# Patient Record
Sex: Female | Born: 1985 | Race: Black or African American | Hispanic: No | Smoking: Never smoker
Health system: Southern US, Community
[De-identification: ages and names within clinical notes are randomized; demographics above are authoritative.]

## PROBLEM LIST (undated history)

## (undated) DIAGNOSIS — E349 Endocrine disorder, unspecified: Secondary | ICD-10-CM

## (undated) HISTORY — DX: Endocrine disorder, unspecified: E34.9

## (undated) HISTORY — PX: CYST REMOVAL HAND: SHX6279

---

## 2016-12-19 HISTORY — PX: BREAST SURGERY: SHX581

## 2016-12-19 HISTORY — PX: OTHER SURGICAL HISTORY: SHX169

## 2017-07-31 ENCOUNTER — Ambulatory Visit: Payer: Self-pay | Admitting: Obstetrics and Gynecology

## 2017-08-01 ENCOUNTER — Ambulatory Visit (INDEPENDENT_AMBULATORY_CARE_PROVIDER_SITE_OTHER): Payer: Managed Care, Other (non HMO) | Admitting: Obstetrics and Gynecology

## 2017-08-01 ENCOUNTER — Encounter: Payer: Self-pay | Admitting: Obstetrics and Gynecology

## 2017-08-01 VITALS — BP 118/82 | HR 77 | Ht 66.0 in | Wt 177.0 lb

## 2017-08-01 DIAGNOSIS — Z124 Encounter for screening for malignant neoplasm of cervix: Secondary | ICD-10-CM

## 2017-08-01 DIAGNOSIS — Z0000 Encounter for general adult medical examination without abnormal findings: Secondary | ICD-10-CM

## 2017-08-01 DIAGNOSIS — Z01419 Encounter for gynecological examination (general) (routine) without abnormal findings: Secondary | ICD-10-CM | POA: Diagnosis not present

## 2017-08-01 DIAGNOSIS — N979 Female infertility, unspecified: Secondary | ICD-10-CM | POA: Diagnosis not present

## 2017-08-02 ENCOUNTER — Encounter: Payer: Self-pay | Admitting: Obstetrics and Gynecology

## 2017-08-02 NOTE — Progress Notes (Signed)
Gynecology Annual Exam  PCP: Patient, No Pcp Per  Chief Complaint:  Chief Complaint  Patient presents with  . Gynecologic Exam    vaginal lump on outside  . general labs    History of Present Illness: Patient is a 32 y.o. [id number] presents for annual exam. The patient recently moved here from [place]. She reports that in [place] she was being evaluated for infertility. She says that she began having hot flashes a few years ago. She has a FSH and AMH drawn which showed that she was menopausal. She says that she changed her diet and started exercising more. She reports that her hotflashes resolved and now she has been having more regular periods. She reports that she was seeing a REI, having regular ultrasounds to access if she was ovulating.  I discussed with the patient that given her history of an elevated FSH it would be best to refer her to an REI specialist to continue treatment. The patient reports that her benefits for REI referral have ended and she declined a referral.  LMP: Patient's last menstrual period was 07/14/2017. Average Interval: regular, 30 + days days Duration of flow: 5 days Heavy Menses: no Clots: no Intermenstrual Bleeding: no Postcoital Bleeding: no Dysmenorrhea: no   The patient is sexually active. She currently uses none for contraception. She denies dyspareunia.  The patient does not perform self breast exams.  There is no notable family history of breast or ovarian cancer in her family.  The patient wears seatbelts: yes.   The patient has regular exercise: yes.    The patient denies current symptoms of depression.    Review of Systems: Review of Systems  Constitutional: Negative for chills, fever, malaise/fatigue and weight loss.  HENT: Negative for congestion, hearing loss and sinus pain.   Eyes: Negative for blurred vision and double vision.  Respiratory: Negative for cough, sputum production, shortness of breath and wheezing.   Cardiovascular:  Negative for chest pain, palpitations, orthopnea and leg swelling.  Gastrointestinal: Negative for abdominal pain, constipation, diarrhea, nausea and vomiting.  Genitourinary: Negative for dysuria, flank pain, frequency, hematuria and urgency.  Musculoskeletal: Negative for back pain, falls and joint pain.  Skin: Negative for itching and rash.  Neurological: Negative for dizziness and headaches.  Psychiatric/Behavioral: Negative for depression, substance abuse and suicidal ideas. The patient is not nervous/anxious.     Past Medical History:  History reviewed. No pertinent past medical history.  Past Surgical History:  Past Surgical History:  Procedure Laterality Date  . BREAST SURGERY  12/19/2016   Reduction  . CESAREAN SECTION  05/03/2010  . tummy tuck  12/19/2016    Gynecologic History:  Patient's last menstrual period was 07/14/2017. Contraception: none Last Pap: Results were: unknown  Obstetric History: G2P1012 Hx of twin gestation   Family History:  Family History  Problem Relation Age of Onset  . Breast cancer Other 63    Social History:  Social History   Socioeconomic History  . Marital status: Married    Spouse name: Not on file  . Number of children: Not on file  . Years of education: Not on file  . Highest education level: Not on file  Social Needs  . Financial resource strain: Not on file  . Food insecurity - worry: Not on file  . Food insecurity - inability: Not on file  . Transportation needs - medical: Not on file  . Transportation needs - non-medical: Not on file  Occupational History  .  Not on file  Tobacco Use  . Smoking status: Never Smoker  . Smokeless tobacco: Never Used  Substance and Sexual Activity  . Alcohol use: No    Frequency: Never  . Drug use: No  . Sexual activity: Yes    Partners: Male    Birth control/protection: None  Other Topics Concern  . Not on file  Social History Narrative  . Not on file    Allergies:    Allergies  Allergen Reactions  . Iodine Anaphylaxis  . Latex Hives  . Penicillins     Medications: Prior to Admission medications   Not on File    Physical Exam Vitals: Blood pressure 118/82, pulse 77, height 5\' 6"  (1.676 m), weight 177 lb (80.3 kg), last menstrual period 07/14/2017.  Physical Exam  Constitutional: She is oriented to person, place, and time. She appears well-developed.  Genitourinary: Vagina normal and uterus normal. There is no lesion on the right labia. There is no lesion on the left labia. Vagina exhibits no lesion. Right adnexum does not display mass. Left adnexum does not display mass. Cervix does not exhibit motion tenderness.  HENT:  Head: Normocephalic and atraumatic.  Eyes: EOM are normal.  Neck: Neck supple. No thyromegaly present.  Cardiovascular: Normal rate, regular rhythm and normal heart sounds.  Pulmonary/Chest: Effort normal and breath sounds normal. Right breast exhibits no inverted nipple, no mass, no nipple discharge and no skin change. Left breast exhibits no inverted nipple, no mass, no nipple discharge and no skin change.  Abdominal: Soft. Bowel sounds are normal. She exhibits no distension and no mass.  Neurological: She is alert and oriented to person, place, and time.  Skin: Skin is warm and dry.  Psychiatric: She has a normal mood and affect. Her behavior is normal. Judgment and thought content normal.  Vitals reviewed.    Female chaperone present for pelvic and breast  portions of the physical exam  Assessment: 32 y.o. [id number] routine annual exam  Plan: Problem List Items Addressed This Visit    None    Visit Diagnoses    Screening for cervical cancer    -  Primary   Relevant Orders   PapIG, HPV, rfx 16/18   Health care maintenance       Relevant Orders   CBC w/Diff   Comprehensive metabolic panel   Lipid panel   TSH   Infertility, female       Relevant Orders   Anti mullerian hormone   FSH/LH     1) STI screening  was offered and declined  2) ASCCP guidelines and rational discussed.  Patient opts for pap smear performed today.   3) Routine healthcare maintenance including cholesterol, diabetes screening discussed To return fasting at a later date  4) Hx of infertility, patient declines referral. Desires to have FSH and AMH repeated since menses have resumed. Will have her return to discuss infertility management plan.  Adelene Idlerhristanna Konstantin Lehnen MD 08/02/17 10:23 PM

## 2017-08-05 LAB — PAPIG, HPV, RFX 16/18
HPV, HIGH-RISK: NEGATIVE
PAP SMEAR COMMENT: 0

## 2017-08-05 NOTE — Progress Notes (Signed)
Called patient, will need to repeat pap smear. Patient aware.

## 2017-08-07 ENCOUNTER — Other Ambulatory Visit: Payer: Managed Care, Other (non HMO)

## 2017-08-08 ENCOUNTER — Other Ambulatory Visit: Payer: Managed Care, Other (non HMO)

## 2017-08-08 ENCOUNTER — Other Ambulatory Visit: Payer: Self-pay | Admitting: Obstetrics and Gynecology

## 2017-08-15 LAB — CBC WITH DIFFERENTIAL/PLATELET
BASOS ABS: 0 10*3/uL (ref 0.0–0.2)
Basos: 0 %
EOS (ABSOLUTE): 0 10*3/uL (ref 0.0–0.4)
Eos: 1 %
Hematocrit: 33.7 % — ABNORMAL LOW (ref 34.0–46.6)
Hemoglobin: 11.2 g/dL (ref 11.1–15.9)
Immature Grans (Abs): 0 10*3/uL (ref 0.0–0.1)
Immature Granulocytes: 0 %
LYMPHS ABS: 1.6 10*3/uL (ref 0.7–3.1)
Lymphs: 39 %
MCH: 29 pg (ref 26.6–33.0)
MCHC: 33.2 g/dL (ref 31.5–35.7)
MCV: 87 fL (ref 79–97)
MONOS ABS: 0.2 10*3/uL (ref 0.1–0.9)
Monocytes: 5 %
Neutrophils Absolute: 2.3 10*3/uL (ref 1.4–7.0)
Neutrophils: 55 %
Platelets: 241 10*3/uL (ref 150–379)
RBC: 3.86 x10E6/uL (ref 3.77–5.28)
RDW: 13.1 % (ref 12.3–15.4)
WBC: 4.1 10*3/uL (ref 3.4–10.8)

## 2017-08-15 LAB — COMPREHENSIVE METABOLIC PANEL
ALK PHOS: 49 IU/L (ref 39–117)
ALT: 9 IU/L (ref 0–32)
AST: 12 IU/L (ref 0–40)
Albumin/Globulin Ratio: 1.5 (ref 1.2–2.2)
Albumin: 4.3 g/dL (ref 3.5–5.5)
BILIRUBIN TOTAL: 0.4 mg/dL (ref 0.0–1.2)
BUN/Creatinine Ratio: 7 — ABNORMAL LOW (ref 9–23)
BUN: 6 mg/dL (ref 6–20)
CHLORIDE: 104 mmol/L (ref 96–106)
CO2: 23 mmol/L (ref 20–29)
Calcium: 9.3 mg/dL (ref 8.7–10.2)
Creatinine, Ser: 0.89 mg/dL (ref 0.57–1.00)
GFR calc Af Amer: 99 mL/min/{1.73_m2} (ref >59)
GFR calc non Af Amer: 86 mL/min/{1.73_m2} (ref >59)
GLUCOSE: 97 mg/dL (ref 65–99)
Globulin, Total: 2.9 g/dL (ref 1.5–4.5)
Potassium: 4.2 mmol/L (ref 3.5–5.2)
SODIUM: 142 mmol/L (ref 134–144)
Total Protein: 7.2 g/dL (ref 6.0–8.5)

## 2017-08-15 LAB — LIPID PANEL
CHOLESTEROL TOTAL: 171 mg/dL (ref 100–199)
Chol/HDL Ratio: 3.2 ratio (ref 0.0–4.4)
HDL: 54 mg/dL (ref >39)
LDL CALC: 105 mg/dL — AB (ref 0–99)
TRIGLYCERIDES: 58 mg/dL (ref 0–149)
VLDL Cholesterol Cal: 12 mg/dL (ref 5–40)

## 2017-08-15 LAB — FSH/LH
FSH: 17.4 m[IU]/mL
LH: 12.1 m[IU]/mL

## 2017-08-15 LAB — TSH: TSH: 1.15 u[IU]/mL (ref 0.450–4.500)

## 2017-08-15 LAB — ANTI MULLERIAN HORMONE: ANTI-MULLERIAN HORMONE (AMH): <0.015 ng/mL

## 2017-08-19 NOTE — Progress Notes (Signed)
Called patient. Left office to call to discuss results. Patient has very low AMH, low ovarian reserve.

## 2017-08-20 ENCOUNTER — Telehealth: Payer: Self-pay

## 2017-08-20 ENCOUNTER — Other Ambulatory Visit: Payer: Self-pay | Admitting: Obstetrics and Gynecology

## 2017-08-20 DIAGNOSIS — Z30016 Encounter for initial prescription of transdermal patch hormonal contraceptive device: Secondary | ICD-10-CM

## 2017-08-20 MED ORDER — NORELGESTROMIN-ETH ESTRADIOL 150-35 MCG/24HR TD PTWK
1.0000 | MEDICATED_PATCH | TRANSDERMAL | 12 refills | Status: DC
Start: 2017-08-20 — End: 2017-11-19

## 2017-08-20 NOTE — Telephone Encounter (Signed)
Pt is calling needing results for her labs. Please advise

## 2017-08-20 NOTE — Telephone Encounter (Signed)
Pt called after hour nurse stating she was returning a call from the office re lab results.  [phone]

## 2017-08-20 NOTE — Progress Notes (Signed)
Called and discussed results with patient. Low AMH, high FSH, recommended referral to fertility clinic, patient would likely need donor eggs. Patient doe snot have fertility benefits at this time and does not want to be referred.  Recommended starting OCPs as hormonal replacement - patient would like to initiate the patch. Discussed healthy lifestyle option for lowering cholesterol, she is vegan so has a healthy diet.

## 2017-08-20 NOTE — Telephone Encounter (Signed)
Patient is returning missed call. Please advise patient to waiting to here lab results. Per pt ok to leave detailed message

## 2017-08-29 ENCOUNTER — Encounter: Payer: Self-pay | Admitting: Obstetrics and Gynecology

## 2017-08-29 ENCOUNTER — Ambulatory Visit (INDEPENDENT_AMBULATORY_CARE_PROVIDER_SITE_OTHER): Payer: Managed Care, Other (non HMO) | Admitting: Obstetrics and Gynecology

## 2017-08-29 VITALS — BP 118/78 | HR 61 | Ht 66.0 in | Wt 175.0 lb

## 2017-08-29 DIAGNOSIS — E2839 Other primary ovarian failure: Secondary | ICD-10-CM | POA: Insufficient documentation

## 2017-08-29 DIAGNOSIS — E288 Other ovarian dysfunction: Secondary | ICD-10-CM | POA: Diagnosis not present

## 2017-08-29 NOTE — Progress Notes (Signed)
Patient ID: Yvonne Black, female   DOB: 1986-06-02, 32 y.o.   MRN: [id number]  Reason for Consult: Follow-up   Referred by No ref. provider found  Subjective:     HPI:  Yvonne Black is a 32 y.o. female she is a transfer from [place]. 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 AMH and FSH repeated here at the patient's request showed the same results. I spoke with the patient previously over the phone about these results. I recommended that she start hormone replacement therapy. The patient wanted to start the patch which I ordered for her. The patient has not initiated therapy with the patch. With review of her medical records I see that the same therapy was recommended in [place], but the patient reports that she never initiated this therapy their either. Today I reiterated that replacement hormone therapy was important to reduce her risk of heart disease, stroke, and decreased bone density. Discussed that her best chances of conceiving would be with donor eggs and IVF and that she would need to see a REI specialist for this therapy. Patient declines REI referral at this time.   No past medical history on file. Family History  Problem Relation Age of Onset  . Breast cancer Other 50   Past Surgical History:  Procedure Laterality Date  . BREAST SURGERY  12/19/2016   Reduction  . CESAREAN SECTION  05/03/2010  . tummy tuck  12/19/2016    Short Social History:  Social History   Tobacco Use  . Smoking status: Never Smoker  . Smokeless tobacco: Never Used  Substance Use Topics  . Alcohol use: No    Frequency: Never    Allergies  Allergen Reactions  . Iodine Anaphylaxis  . Latex Hives  . Penicillins     Current Outpatient Medications  Medication Sig Dispense Refill  . norelgestromin-ethinyl estradiol [facility]) 150-35 MCG/24HR transdermal patch Place 1 patch onto the skin once a week. 3 patch 12   No current facility-administered medications for this visit.       Review of Systems  Constitutional: Negative for chills, fatigue, fever and unexpected weight change.  HENT: Negative for trouble swallowing.  Eyes: Negative for loss of vision.  Respiratory: Negative for cough, shortness of breath and wheezing.  Cardiovascular: Negative for chest pain, leg swelling, palpitations and syncope.  GI: Negative for abdominal pain, blood in stool, diarrhea, nausea and vomiting.  GU: Negative for difficulty urinating, dysuria, frequency and hematuria.  Musculoskeletal: Negative for back pain, leg pain and joint pain.  Skin: Negative for rash.  Neurological: Negative for dizziness, headaches, light-headedness, numbness and seizures.  Psychiatric: Negative for behavioral problem, confusion, depressed mood and sleep disturbance.        Objective:  Objective   Vitals:   08/29/17 0851  BP: 118/78  Pulse: 61  Weight: 175 lb (79.4 kg)  Height: 5\' 6"  (1.676 m)   Body mass index is 28.25 kg/m.  Physical Exam  Constitutional: She is oriented to person, place, and time. She appears well-developed and well-nourished.  HENT:  Head: Normocephalic and atraumatic.  Eyes: EOM are normal.  Cardiovascular: Normal rate and regular rhythm.  Pulmonary/Chest: Effort normal.  Neurological: She is alert and oriented to person, place, and time.  Skin: Skin is warm and dry.  Psychiatric: She has a normal mood and affect. Her behavior is normal. Judgment and thought content normal.  Nursing note and vitals reviewed.    Assessment/Plan:     Discussed with patient  low amh, primary ovarian failure.  And hormone replacement therapy. Patient will start patch.   Patient to return for pap smear repeat which was insufficient sample.  Natale Milchhristanna R Kaylena Pacifico MD

## 2017-09-05 ENCOUNTER — Ambulatory Visit: Payer: Managed Care, Other (non HMO) | Admitting: Obstetrics and Gynecology

## 2017-11-19 ENCOUNTER — Other Ambulatory Visit: Payer: Self-pay

## 2017-11-19 DIAGNOSIS — Z30016 Encounter for initial prescription of transdermal patch hormonal contraceptive device: Secondary | ICD-10-CM

## 2017-11-19 MED ORDER — NORELGESTROMIN-ETH ESTRADIOL 150-35 MCG/24HR TD PTWK: 1.0000 | MEDICATED_PATCH | TRANSDERMAL | 3 refills | Status: DC

## 2017-11-19 NOTE — Telephone Encounter (Signed)
Pt needs 37m supply of zulane sent to pharm instead of 48m at a time.  [phone] Pt aware this was done.

## 2018-04-08 ENCOUNTER — Encounter: Payer: Self-pay | Admitting: Medical Oncology

## 2018-04-08 ENCOUNTER — Emergency Department
Admission: EM | Admit: 2018-04-08 | Discharge: 2018-04-08 | Disposition: A | Discharge status: Home / Self Care | Payer: Self-pay | Attending: Emergency Medicine | Admitting: Emergency Medicine

## 2018-04-08 DIAGNOSIS — Z91040 Latex allergy status: Secondary | ICD-10-CM | POA: Insufficient documentation

## 2018-04-08 DIAGNOSIS — A09 Infectious gastroenteritis and colitis, unspecified: Secondary | ICD-10-CM | POA: Insufficient documentation

## 2018-04-08 DIAGNOSIS — R109 Unspecified abdominal pain: Secondary | ICD-10-CM | POA: Insufficient documentation

## 2018-04-08 LAB — CBC
HCT: 32.2 % — ABNORMAL LOW (ref 35.0–47.0)
Hemoglobin: 11.3 g/dL — ABNORMAL LOW (ref 12.0–16.0)
MCH: 30.4 pg (ref 26.0–34.0)
MCHC: 35 g/dL (ref 32.0–36.0)
MCV: 86.8 fL (ref 80.0–100.0)
PLATELETS: 253 10*3/uL (ref 150–440)
RBC: 3.7 MIL/uL — ABNORMAL LOW (ref 3.80–5.20)
RDW: 13.1 % (ref 11.5–14.5)
WBC: 4.3 10*3/uL (ref 3.6–11.0)

## 2018-04-08 LAB — COMPREHENSIVE METABOLIC PANEL
ALBUMIN: 3.6 g/dL (ref 3.5–5.0)
ALT: 16 U/L (ref 0–44)
ANION GAP: 5 (ref 5–15)
AST: 18 U/L (ref 15–41)
Alkaline Phosphatase: 48 U/L (ref 38–126)
BUN: 7 mg/dL (ref 6–20)
CO2: 28 mmol/L (ref 22–32)
Calcium: 8.9 mg/dL (ref 8.9–10.3)
Chloride: 107 mmol/L (ref 98–111)
Creatinine, Ser: 0.78 mg/dL (ref 0.44–1.00)
GFR calc Af Amer: >60 mL/min (ref >60)
GFR calc non Af Amer: >60 mL/min (ref >60)
GLUCOSE: 117 mg/dL — AB (ref 70–99)
POTASSIUM: 3.7 mmol/L (ref 3.5–5.1)
SODIUM: 140 mmol/L (ref 135–145)
TOTAL PROTEIN: 6.9 g/dL (ref 6.5–8.1)
Total Bilirubin: 0.6 mg/dL (ref 0.3–1.2)

## 2018-04-08 LAB — GASTROINTESTINAL PANEL BY PCR, STOOL (REPLACES STOOL CULTURE)
ASTROVIRUS: NOT DETECTED
Adenovirus F40/41: NOT DETECTED
CAMPYLOBACTER SPECIES: NOT DETECTED
CRYPTOSPORIDIUM: NOT DETECTED
CYCLOSPORA CAYETANENSIS: NOT DETECTED
ENTEROTOXIGENIC E COLI (ETEC): DETECTED — AB
Entamoeba histolytica: NOT DETECTED
Enteroaggregative E coli (EAEC): DETECTED — AB
Enteropathogenic E coli (EPEC): DETECTED — AB
Giardia lamblia: NOT DETECTED
Norovirus GI/GII: NOT DETECTED
PLESIMONAS SHIGELLOIDES: NOT DETECTED
Rotavirus A: NOT DETECTED
SAPOVIRUS (I, II, IV, AND V): NOT DETECTED
SHIGA LIKE TOXIN PRODUCING E COLI (STEC): NOT DETECTED
Salmonella species: NOT DETECTED
Shigella/Enteroinvasive E coli (EIEC): NOT DETECTED
VIBRIO SPECIES: NOT DETECTED
Vibrio cholerae: NOT DETECTED
Yersinia enterocolitica: NOT DETECTED

## 2018-04-08 LAB — POCT PREGNANCY, URINE: Preg Test, Ur: NEGATIVE

## 2018-04-08 MED ORDER — AZITHROMYCIN 500 MG PO TABS: 500.0000 mg | ORAL_TABLET | Freq: Every day | ORAL | 0 refills | Status: AC

## 2018-04-08 NOTE — ED Triage Notes (Signed)
Pt reports that she went to [place] about 2 weeks ago and got food poisoning there pt reports she has continued to have diarrhea with lower abd pain. Denies fever.

## 2018-04-08 NOTE — ED Notes (Signed)

## 2018-04-08 NOTE — ED Provider Notes (Signed)
[facility] Emergency Department Provider Note   ____________________________________________    I have reviewed the triage vital signs and the nursing notes.   HISTORY  Chief Complaint Diarrhea and Abdominal Pain     HPI Yvonne Black is a 32 y.o. female who presents with complaints of diarrhea.  Patient reports that she went to [place] 3 weeks ago, developed diarrhea there, it seemed to get better after taking Imodium but then started again after 3 days and has been daily since then.  She has some vague abdominal cramping occasionally.  She continues to take Imodium with little improvement.  Denies fevers or chills, nonbloody diarrhea.  No nausea or vomiting   History reviewed. No pertinent past medical history.  Patient Active Problem List   Diagnosis Date Noted  . Premature ovarian failure 08/29/2017    Past Surgical History:  Procedure Laterality Date  . BREAST SURGERY  12/19/2016   Reduction  . CESAREAN SECTION  05/03/2010  . tummy tuck  12/19/2016    Prior to Admission medications   Medication Sig Start Date End Date Taking? Authorizing Provider  azithromycin (ZITHROMAX) 500 MG tablet Take 1 tablet (500 mg total) by mouth daily for 5 days. Take 1 tablet daily for 3 days. 04/08/18 04/13/18  Jene Every, MD  norelgestromin-ethinyl estradiol [facility]) 150-35 MCG/24HR transdermal patch Place 1 patch onto the skin once a week. 11/19/17   Natale Milch, MD     Allergies Iodine; Latex; and Penicillins  Family History  Problem Relation Age of Onset  . Breast cancer Other 33    Social History Social History   Tobacco Use  . Smoking status: Never Smoker  . Smokeless tobacco: Never Used  Substance Use Topics  . Alcohol use: No    Frequency: Never  . Drug use: No    Review of Systems  Constitutional: No fever/chills Eyes: No visual changes.  ENT: No sore throat. Cardiovascular: Denies chest pain. Respiratory: Denies  shortness of breath. Gastrointestinal: As above Genitourinary: Negative for dysuria. Musculoskeletal: Negative for back pain. Skin: Negative for rash. Neurological: Negative for headaches or weakness   ____________________________________________   PHYSICAL EXAM:  VITAL SIGNS: ED Triage Vitals [04/08/18 0811]  Enc Vitals Group     BP 133/68     Pulse Rate 76     Resp 16     Temp 98.6 F (37 C)     Temp Source Oral     SpO2 97 %     Weight 79.8 kg (176 lb)     Height 1.676 m (5\' 6" )     Head Circumference      Peak Flow      Pain Score 0     Pain Loc      Pain Edu?      Excl. in GC?     Constitutional: Alert and oriented.   Nose: No congestion/rhinnorhea. Mouth/Throat: Mucous membranes are moist.    Cardiovascular: Normal rate, regular rhythm. Grossly normal heart sounds.  Good peripheral circulation. Respiratory: Normal respiratory effort.  No retractions. Lungs CTAB. Gastrointestinal: Soft and nontender. No distention.  No CVA tenderness.  Musculoskeletal: No lower extremity tenderness nor edema.   Neurologic:  Normal speech and language. No gross focal neurologic deficits are appreciated.  Skin:  Skin is warm, dry and intact. No rash noted. Psychiatric: Mood and affect are normal. Speech and behavior are normal.  ____________________________________________   LABS (all labs ordered are listed, but only abnormal results are  displayed)  Labs Reviewed  CBC - Abnormal; Notable for the following components:      Result Value   RBC 3.70 (*)    Hemoglobin 11.3 (*)    HCT 32.2 (*)    All other components within normal limits  COMPREHENSIVE METABOLIC PANEL - Abnormal; Notable for the following components:   Glucose, Bld 117 (*)    All other components within normal limits  GASTROINTESTINAL PANEL BY PCR, STOOL (REPLACES STOOL CULTURE)  OVA + PARASITE EXAM  POCT PREGNANCY, URINE    ____________________________________________  EKG  None ____________________________________________  RADIOLOGY  None ____________________________________________   PROCEDURES  Procedure(s) performed: No  Procedures   Critical Care performed: No ____________________________________________   INITIAL IMPRESSION / ASSESSMENT AND PLAN / ED COURSE  Pertinent labs & imaging results that were available during my care of the patient were reviewed by me and considered in my medical decision making (see chart for details).  Patient with likely traveler's diarrhea, exam is reassuring.  Will check basic labs, O&P, GI panel will start antibiotics outpatient follow-up with PCP.    ____________________________________________   FINAL CLINICAL IMPRESSION(S) / ED DIAGNOSES  Final diagnoses:  Traveler's diarrhea        Note:  This document was prepared using Dragon voice recognition software and may include unintentional dictation errors.    Jene EveryKinner, Twilla Khouri, MD 04/08/18 [phone]

## 2018-04-14 LAB — O&P RESULT

## 2018-04-14 LAB — OVA + PARASITE EXAM

## 2019-01-08 ENCOUNTER — Telehealth: Payer: Self-pay

## 2019-01-08 NOTE — Telephone Encounter (Signed)
Coronavirus (COVID-19) Are you at risk?  Are you at risk for the Coronavirus (COVID-19)?  To be considered HIGH RISK for Coronavirus (COVID-19), you have to meet the following criteria:  . Traveled to [place], [place], [place], [place] or [place]; or in the [place] to [place], [place], [place], or [place]; and have fever, cough, and shortness of breath within the last 2 weeks of travel OR . Been in close contact with a person diagnosed with COVID-19 within the last 2 weeks and have fever, cough, and shortness of breath . IF YOU DO NOT MEET THESE CRITERIA, YOU ARE CONSIDERED LOW RISK FOR COVID-19.  What to do if you are HIGH RISK for COVID-19?  . If you are having a medical emergency, call 911. . Seek medical care right away. Before you go to a doctor's office, urgent care or emergency department, call ahead and tell them about your recent travel, contact with someone diagnosed with COVID-19, and your symptoms. You should receive instructions from your physician's office regarding next steps of care.  . When you arrive at healthcare provider, tell the healthcare staff immediately you have returned from visiting [place], [place], [place], [place] or [place]; or traveled in the [place] to [place], [place], [place], or [place]; in the last two weeks or you have been in close contact with a person diagnosed with COVID-19 in the last 2 weeks.   . Tell the health care staff about your symptoms: fever, cough and shortness of breath. . After you have been seen by a medical provider, you will be either: o Tested for (COVID-19) and discharged home on quarantine except to seek medical care if symptoms worsen, and asked to  - Stay home and avoid contact with others until you get your results (4-5 days)  - Avoid travel on public transportation if possible (such as bus, train, or airplane) or o Sent to the Emergency Department by EMS for evaluation, COVID-19 testing, and possible  admission depending on your condition and test results.  What to do if you are LOW RISK for COVID-19?  Reduce your risk of any infection by using the same precautions used for avoiding the common cold or flu:  . Wash your hands often with soap and warm water for at least 20 seconds.  If soap and water are not readily available, use an alcohol-based hand sanitizer with at least 60% alcohol.  . If coughing or sneezing, cover your mouth and nose by coughing or sneezing into the elbow areas of your shirt or coat, into a tissue or into your sleeve (not your hands). . Avoid shaking hands with others and consider head nods or verbal greetings only. . Avoid touching your eyes, nose, or mouth with unwashed hands.  . Avoid close contact with people who are sick. . Avoid places or events with large numbers of people in one location, like concerts or sporting events. . Carefully consider travel plans you have or are making. . If you are planning any travel outside or inside the [place], visit the CDC's Travelers' Health webpage for the latest health notices. . If you have some symptoms but not all symptoms, continue to monitor at home and seek medical attention if your symptoms worsen. . If you are having a medical emergency, call 911.   ADDITIONAL HEALTHCARE OPTIONS FOR PATIENTS  [facility] / e-Visit: [email or web address]         [facility]: [phone]  [facility]   Urgent Care: [facility]: [phone]    Pre-screen negative, DM. Patient c/o nausea but states "It's because I'm pregnant".

## 2019-01-09 ENCOUNTER — Encounter: Payer: Self-pay | Admitting: Certified Nurse Midwife

## 2019-01-09 ENCOUNTER — Other Ambulatory Visit: Payer: Self-pay

## 2019-01-09 ENCOUNTER — Ambulatory Visit (INDEPENDENT_AMBULATORY_CARE_PROVIDER_SITE_OTHER): Payer: Medicaid Other | Admitting: Certified Nurse Midwife

## 2019-01-09 ENCOUNTER — Other Ambulatory Visit (HOSPITAL_COMMUNITY)
Admission: RE | Admit: 2019-01-09 | Discharge: 2019-01-09 | Disposition: A | Discharge status: Home / Self Care | Payer: Medicaid Other | Source: Ambulatory Visit | Attending: Certified Nurse Midwife | Admitting: Certified Nurse Midwife

## 2019-01-09 VITALS — BP 119/78 | HR 73 | Ht 65.0 in | Wt 198.8 lb

## 2019-01-09 DIAGNOSIS — Z8639 Personal history of other endocrine, nutritional and metabolic disease: Secondary | ICD-10-CM

## 2019-01-09 DIAGNOSIS — Z6833 Body mass index (BMI) 33.0-33.9, adult: Secondary | ICD-10-CM | POA: Diagnosis not present

## 2019-01-09 DIAGNOSIS — N898 Other specified noninflammatory disorders of vagina: Secondary | ICD-10-CM

## 2019-01-09 DIAGNOSIS — Z9889 Other specified postprocedural states: Secondary | ICD-10-CM

## 2019-01-09 DIAGNOSIS — N911 Secondary amenorrhea: Secondary | ICD-10-CM | POA: Diagnosis not present

## 2019-01-09 LAB — POCT URINE PREGNANCY: Preg Test, Ur: NEGATIVE

## 2019-01-09 NOTE — Progress Notes (Signed)
Patient c/o no menses since mid February, had +HPT 1 month ago, -HPT 1 week ago.  Patient states "I have a high [place]" was told by previous GYN provider in 2018.

## 2019-01-09 NOTE — Patient Instructions (Signed)
Primary Ovarian Insufficiency  Primary ovarian insufficiency is a condition in which the ovaries stop working in women under age 34. The ovaries have a fixed number of eggs, which are stored in fluid-filled sacs (follicles). They also produce the female sex hormones, including estrogen. After puberty, female hormones trigger the release of an egg from a follicle (ovulation) each month. If the egg does not get fertilized by a sperm, a woman will have a menstrual period. Throughout life, the number of follicles in the ovaries slowly declines. A condition called menopause occurs when there are no follicles left. When this occurs, ovulation stops and the level of estrogen drops. This is a natural process and occurs in all women by about age 34. If you have primary ovarian failure, the loss of follicles and ability to produce estrogen occurs at a much younger age. What are the causes? In most cases, the exact cause of this condition is not known. Some known causes include:  Not having enough follicles. You are born with a certain number of follicles. You may not have enough to last 40 or more years.  Cancer treatments that damage follicles. These include medicines (chemotherapy), high energy waves (radiation), or surgery.  Follicles that do not respond to the brain hormone that tells follicles to release eggs (follicle-stimulating hormone or FSH).  Having a genetic disorder that causes abnormal ovaries (Turner syndrome and fragile X syndrome).  Having a condition in which your immune system attacks your ovaries (autoimmune disease). What increases the risk? You are more likely to develop this condition if:  You smoke.  You have diabetes.  You have a family history of primary ovarian insufficiency. What are the symptoms? Symptoms of this condition include:  Inability to get pregnant.  Irregular or missed periods.  Other symptoms can be caused by low levels of estrogen. They include: ? Night  sweats. ? Hot flashes. ? Dry vagina, which can cause pain during sex. ? Loss of interest in sex (low libido). ? Irritability. ? Trouble sleeping. ? Confusion. ? Thinning bones (osteoporosis), which may cause bones to break easily. In many cases, there are no symptoms for this condition. How is this diagnosed? This condition may be diagnosed based on:  Your symptoms. Your health care provider will ask you questions about your symptoms. He or she may suspect primary ovarian insufficiency if you have trouble getting pregnant or if you have irregular or missed periods.  A physical exam.  Blood tests. This is done to confirm the diagnosis. The test will check for: ? Low levels of estrogen. ? High levels of FSH. Follicle loss and low levels of estrogen will cause your brain to release more FSH. ? Low levels of anti-Mullerian hormone (AMH). As follicles are lost, this hormone also decreases. If you have low estrogen associated with low AMH and high FSH, your health care provider may do more tests to look for a possible cause of your primary ovarian insufficiency. These may include genetic testing and testing for autoimmune disease. How is this treated? This condition is treated with hormone replacement therapy, which replaces the female hormones estrogen and progesterone. These hormones may be given as oral medicines or as a skin patch. This treatment may:  Relieve the symptoms of estrogen deficiency.  Prevent osteoporosis.  Protect from heart disease. Treatment often goes on until around age 55 when menopause usually occurs. There is no treatment that can fully restore fertility. However, a small number of women can get pregnant after being  diagnosed with this condition. If you desire to get pregnant now or in the future, talk to a fertility specialist right away about your options. Follow these instructions at home:   Take over-the-counter and prescription medicines only as told by your  health care provider.  Ask your health care provider about the risks and side effects of hormone replacement therapy.  Eat foods rich in calcium and vitamin D. These include milk, other dairy products, and orange juice or breakfast cereals that have vitamin D added (fortified). These may help to prevent osteoporosis.  Do exercise regularly. This can make your muscles and bones stronger. Ask your health care provider which activities are safe for you.  Do not use any products that contain nicotine or tobacco, such as cigarettes and e-cigarettes. If you need help quitting, ask your health care provider.  Keep all follow-up visits as told by your health care provider. This is important. Contact a health care provider if:  You have symptoms of low estrogen.  Your periods stop or are not regular.  You have side effects from your medicines. Get help right away if:  You break (fracture) a bone.  You have chest pain or trouble breathing. Summary  Primary ovarian insufficiency results when your ovaries stop working normally before age 41.  In most cases, the exact cause of this condition is not known.  The first sign of this disorder may be difficulty getting pregnant.  Symptoms of this condition include irregular or missed periods and symptoms of low estrogen.  This condition is treated with hormone replacement therapy. This reduces symptoms of low estrogen and protects from osteoporosis. This information is not intended to replace advice given to you by your health care provider. Make sure you discuss any questions you have with your health care provider. Document Released: 10/09/2017 Document Revised: 10/09/2017 Document Reviewed: 10/09/2017 Elsevier Interactive Patient Education  2019 [place]. Secondary Amenorrhea  Secondary amenorrhea occurs when a female who was previously having menstrual periods has not had them for 3-6 months. A menstrual period is the monthly shedding of  the lining of the uterus. Menstruation involves the passing of blood, tissue, fluid, and mucus through the vagina. The flow of blood usually occurs during 3-7 consecutive days each month. This condition has many causes. In many cases, treating the underlying cause will return menstrual periods back to a normal cycle. What are the causes? The most common cause of this condition is pregnancy. Other causes include:  Malnutrition.  Cirrhosis of the liver.  Conditions of the blood.  Diabetes.  Epilepsy.  Chronic kidney disease.  Polycystic ovary disease.  Stress or anxiety.  A hormonal imbalance.  Ovarian failure.  Medicines.  Extreme obesity.  Cystic fibrosis.  Low body weight or drastic weight loss.  Early menopause.  Removal of the ovaries or uterus.  Contraceptive pills, patches, or vaginal rings.  Cushing syndrome.  Thyroid problems. What increases the risk? You are more likely to develop this condition if:  You have a family history of this condition.  You have an eating disorder.  You do extreme athletic training.  You have a chronic disease.  You abuse substances such as alcohol or cigarettes. What are the signs or symptoms? The main symptom of this condition is a lack of menstrual periods for 3-6 months. How is this diagnosed? This condition may be diagnosed based on:  Your medical history.  A physical exam.  A pelvic exam to check for problems with your reproductive organs.  A procedure to examine the uterus.  A measurement of your body mass index (BMI).  Tests, such as: ? Blood tests that measure certain hormones in your body and rule out pregnancy. ? Urine tests. ? Imaging tests, such as an ultrasound, CT scan, or MRI. How is this treated? Treatment for this condition depends on the cause of the amenorrhea. It may involve:  Correcting dietary problems.  Treating underlying conditions.  Medicines.  Lifestyle changes.  Surgery.  If the condition cannot be corrected, it is sometimes possible to trigger menstrual periods with medicines. Follow these instructions at home: Lifestyle  Maintain a healthy diet. Ask to meet with a registered dietitian for nutrition counseling and meal planning.  Maintain a healthy weight. Talk to your health care provider before trying any new diet or exercise plan.  Exercise at least 30 minutes 5 or more days each week. Exercising includes brisk walking, yard work, biking, running, swimming, and team sports like basketball and soccer. Ask your health care provider which exercises are safe for you.  Get enough sleep. Plan your sleep time to allow for 7-9 hours of sleep each night.  Learn to manage stress. Explore relaxation techniques such as meditation, journaling, yoga, or tai chi. General instructions  Be aware of changes in your menstrual cycle. Keep a record of when you have your menstrual period. Note the date your period starts, how long it lasts, and any problems you experience.  Take over-the-counter and prescription medicines only as told by your health care provider.  Keep all follow-up visits as told by your health care provider. This is important. Contact a health care provider if:  Your periods do not return to normal after treatment. Summary  Secondary amenorrhea is when a female who was previously having menstrual periods has not gotten her period for 3-6 months.  This condition has many causes. In many cases, treating the underlying cause will return menstrual periods back to a normal cycle.  Talk to your health care provider if your periods do not return to normal after treatment. This information is not intended to replace advice given to you by your health care provider. Make sure you discuss any questions you have with your health care provider. Document Released: 08/13/2006 Document Revised: 09/20/2016 Document Reviewed: 09/20/2016 Elsevier Interactive Patient  Education  2019 [employer].

## 2019-01-09 NOTE — Progress Notes (Addendum)
GYN ENCOUNTER NOTE  Subjective:       Yvonne Black is a 33 y.o. G53P1012 female is here for gynecologic evaluation of the following issues:  1. Secondary amenorrhea 2. Positive home pregnancy test  Presents for evaluation of absence menses with positive home pregnancy test. Notes three (3) negative pregnancy test and one (1) positive test last month. Single test taken last week was negative.   Reports history of elevated FSH as diagnosed by previous GYN in 2018. Advised that "home pregnancy tests would not work correctly for her".   Seen at [facility] earlier this year by Cari Caraway, MD and diagnoses of premature ovarian failure confirmed.   Denies difficulty breathing or respiratory distress, chest pain, abdominal pain, excessive vaginal bleeding, dysuria, and leg pain or swelling.    Gynecologic History  Patient's last menstrual period was 09/01/2018 (approximate). Period Pattern: (!) Irregular  Contraception: none   Last Pap: 07/2017. Results were: Insufficient cells/HPV Negative  Obstetric History  OB History  Gravida Para Term Preterm AB Living  2 1 1   1 2   SAB TAB Ectopic Multiple Live Births  1     1 2     # Outcome Date GA Lbr Len/2nd Weight Sex Delivery Anes PTL Lv  2A Term 05/03/10   4 lb 6 oz (1.984 kg) M CS-LTranv   LIV  2B Term 05/03/10   4 lb 4 oz (1.928 kg) M CS-LTranv Spinal N LIV  1 SAB 2008            Past Medical History:  Diagnosis Date  . Hormone disorder     Past Surgical History:  Procedure Laterality Date  . BREAST SURGERY  12/19/2016   Reduction  . CESAREAN SECTION  05/03/2010  . CYST REMOVAL HAND    . tummy tuck  12/19/2016   Allergies  Allergen Reactions  . Iodine Anaphylaxis  . Latex Hives  . Penicillins     Social History   Socioeconomic History  . Marital status: Married    Spouse name: Not on file  . Number of children: Not on file  . Years of education: Not on file  . Highest education level: Not on file   Occupational History  . Not on file  Social Needs  . Financial resource strain: Not on file  . Food insecurity    Worry: Not on file    Inability: Not on file  . Transportation needs    Medical: Not on file    Non-medical: Not on file  Tobacco Use  . Smoking status: Never Smoker  . Smokeless tobacco: Never Used  Substance and Sexual Activity  . Alcohol use: No    Frequency: Never  . Drug use: No  . Sexual activity: Yes    Partners: Male    Birth control/protection: None  Lifestyle  . Physical activity    Days per week: 0 days    Minutes per session: 0 min  . Stress: Only a little  Relationships  . Social Herbalist on phone: More than three times a week    Gets together: Never    Attends religious service: Never    Active member of club or organization: Yes    Attends meetings of clubs or organizations: More than 4 times per year    Relationship status: Married  . Intimate partner violence    Fear of current or ex partner: No    Emotionally abused: No    Physically  abused: No    Forced sexual activity: No  Other Topics Concern  . Not on file  Social History Narrative  . Not on file    Family History  Problem Relation Age of Onset  . Breast cancer Other 50  . Diabetes Maternal Grandmother   . Hypertension Maternal Grandmother   . Diabetes Maternal Grandfather   . Hypertension Maternal Grandfather   . Ovarian cancer Neg Hx   . Colon cancer Neg Hx     The following portions of the patient's history were reviewed and updated as appropriate: allergies, current medications, past family history, past medical history, past social history, past surgical history and problem list.  Review of Systems  ROS negative except as noted above. Information obtained from patient.   Objective:   BP 119/78   Pulse 73   Ht 5\' 5"  (1.651 m)   Wt 198 lb 12.8 oz (90.2 kg)   LMP 09/01/2018 (Approximate)   BMI 33.08 kg/m   CONSTITUTIONAL: Well-developed,  well-nourished female in no  acute distress.   HENT:  Normocephalic, atraumatic.   NECK: Normal range of motion, supple, no masses.  Normal thyroid.   SKIN: Skin is warm and dry. No rash noted. Not diaphoretic. No erythema. No pallor.  NEUROLGIC: Alert and oriented to person, place, and time.   PSYCHIATRIC: Normal mood and affect. Normal behavior. Normal  judgment and thought content.   ABDOMEN: Soft, non distended; Non tender.  No Organomegaly. Surgical scars present.   PELVIC:  External Genitalia: Normal  Vagina: Normal  Cervix: Normal  Uterus: Unable to assess due to habitus  Adnexa: Unable to assess due to habitus  MUSCULOSKELETAL: Normal range of motion. No tenderness.  No cyanosis, clubbing, or edema.  Recent Results (from the past 2160 hour(s))  POCT urine pregnancy     Status: None   Collection Time: 01/09/19  8:31 AM  Result Value Ref Range   Preg Test, Ur Negative Negative  FSH/LH     Status: None   Collection Time: 01/09/19  9:14 AM  Result Value Ref Range   LH 18.5 mIU/mL    Comment:                     Adult Female:                       Follicular phase      2.4 -  12.6                       Ovulation phase      14.0 -  95.6                       Luteal phase          1.0 -  11.4                       Postmenopausal        7.7 -  58.5    FSH 23.1 mIU/mL    Comment:                     Adult Female:                       Follicular phase      3.5 -  12.5  Ovulation phase       4.7 -  21.5                       Luteal phase          1.7 -   7.7                       Postmenopausal       25.8 - 134.8   Beta hCG quant (ref lab)     Status: None   Collection Time: 01/09/19  9:14 AM  Result Value Ref Range   hCG Quant <1 mIU/mL    Comment:                      Female (Non-pregnant)    0 -     5                             (Postmenopausal)  0 -     8                      Female (Pregnant)                      Weeks of Gestation                               3                6 -    71                              4               10 -   750                              5              217 -  7138                              6              158 - 31795                              7             [phone] [id number]                              8            [id number] [id number]                              9            63803 -[id number]                             10            [phone] -  [phone]                             12            [phone] -[phone]                             14            13950 - 62530                             15            12039 - 70971                             16             9040 - 56451                             17             [phone] - [phone]                             18             8099 - [phone] [employer] E CLIA methodology   Thyroid Panel With TSH     Status: None   Collection Time: 01/09/19  9:14 AM  Result Value Ref Range   TSH 1.250 0.450 - 4.500 uIU/mL   T4, Total 6.6 4.5 - 12.0 ug/dL   T3 Uptake Ratio 24 24 - 39 %   Free Thyroxine Index 1.6 1.2 - 4.9     Assessment:   1. Secondary amenorrhea  - POCT urine pregnancy - FSH/LH - Beta hCG quant (ref lab) - Thyroid Panel With TSH - US PELVIS TRANSVAGINAL NON-OB (TV ONLY); Future  2. H/O primary ovarian failure  - FSH/LH - Beta hCG quant (ref lab) - Thyroid Panel With TSH - US PELVIS TRANSVAGINAL NON-OB (TV ONLY); Future  3. BMI 33.0-33.9,adult  - Thyroid Panel With TSH  4. Vaginal discharge  - Cervicovaginal ancillary only   5. History of cosmetic surgery  Plan:   Labs today, see orders. Will contact patient with results.   Will schedule ultrasound, see orders.   Reviewed red flag symptoms and when to call.   RTC for ANNUAL EXAM with PAP.    Gunnar BullaJenkins Michelle Eriyana Sweeten, CNM [facility], [facility] 01/09/19 3:44 PM

## 2019-01-10 ENCOUNTER — Encounter: Payer: Self-pay | Admitting: Certified Nurse Midwife

## 2019-01-10 LAB — THYROID PANEL WITH TSH
Free Thyroxine Index: 1.6 (ref 1.2–4.9)
T3 Uptake Ratio: 24 % (ref 24–39)
T4, Total: 6.6 ug/dL (ref 4.5–12.0)
TSH: 1.25 u[IU]/mL (ref 0.450–4.500)

## 2019-01-10 LAB — FSH/LH
FSH: 23.1 m[IU]/mL
LH: 18.5 m[IU]/mL

## 2019-01-10 LAB — BETA HCG QUANT (REF LAB): hCG Quant: <1 m[IU]/mL

## 2019-01-13 LAB — CERVICOVAGINAL ANCILLARY ONLY
Bacterial vaginitis: POSITIVE — AB
Candida vaginitis: NEGATIVE
Trichomonas: NEGATIVE

## 2019-01-15 ENCOUNTER — Other Ambulatory Visit: Payer: Medicaid Other

## 2019-01-19 ENCOUNTER — Telehealth: Payer: Self-pay

## 2019-01-20 ENCOUNTER — Other Ambulatory Visit: Payer: Self-pay

## 2019-01-20 ENCOUNTER — Ambulatory Visit (INDEPENDENT_AMBULATORY_CARE_PROVIDER_SITE_OTHER): Payer: Medicaid Other

## 2019-01-20 DIAGNOSIS — Z8639 Personal history of other endocrine, nutritional and metabolic disease: Secondary | ICD-10-CM | POA: Diagnosis not present

## 2019-01-20 DIAGNOSIS — N911 Secondary amenorrhea: Secondary | ICD-10-CM

## 2019-01-21 ENCOUNTER — Telehealth: Payer: Self-pay

## 2019-01-21 NOTE — Patient Instructions (Addendum)
Preventive Care 21-34 Years Old, Female Preventive care refers to visits with your health care provider and lifestyle choices that can promote health and wellness. This includes:  A yearly physical exam. This may also be called an annual well check.  Regular dental visits and eye exams.  Immunizations.  Screening for certain conditions.  Healthy lifestyle choices, such as eating a healthy diet, getting regular exercise, not using drugs or products that contain nicotine and tobacco, and limiting alcohol use. What can I expect for my preventive care visit? Physical exam Your health care provider will check your:  Height and weight. This may be used to calculate body mass index (BMI), which tells if you are at a healthy weight.  Heart rate and blood pressure.  Skin for abnormal spots. Counseling Your health care provider may ask you questions about your:  Alcohol, tobacco, and drug use.  Emotional well-being.  Home and relationship well-being.  Sexual activity.  Eating habits.  Work and work environment.  Method of birth control.  Menstrual cycle.  Pregnancy history. What immunizations do I need?  Influenza (flu) vaccine  This is recommended every year. Tetanus, diphtheria, and pertussis (Tdap) vaccine  You may need a Td booster every 10 years. Varicella (chickenpox) vaccine  You may need this if you have not been vaccinated. Human papillomavirus (HPV) vaccine  If recommended by your health care provider, you may need three doses over 6 months. Measles, mumps, and rubella (MMR) vaccine  You may need at least one dose of MMR. You may also need a second dose. Meningococcal conjugate (MenACWY) vaccine  One dose is recommended if you are age 19-21 years and a first-year college student living in a residence hall, or if you have one of several medical conditions. You may also need additional booster doses. Pneumococcal conjugate (PCV13) vaccine  You may need  this if you have certain conditions and were not previously vaccinated. Pneumococcal polysaccharide (PPSV23) vaccine  You may need one or two doses if you smoke cigarettes or if you have certain conditions. Hepatitis A vaccine  You may need this if you have certain conditions or if you travel or work in places where you may be exposed to hepatitis A. Hepatitis B vaccine  You may need this if you have certain conditions or if you travel or work in places where you may be exposed to hepatitis B. Haemophilus influenzae type b (Hib) vaccine  You may need this if you have certain conditions. You may receive vaccines as individual doses or as more than one vaccine together in one shot (combination vaccines). Talk with your health care provider about the risks and benefits of combination vaccines. What tests do I need?  Blood tests  Lipid and cholesterol levels. These may be checked every 5 years starting at age 20.  Hepatitis C test.  Hepatitis B test. Screening  Diabetes screening. This is done by checking your blood sugar (glucose) after you have not eaten for a while (fasting).  Sexually transmitted disease (STD) testing.  BRCA-related cancer screening. This may be done if you have a family history of breast, ovarian, tubal, or peritoneal cancers.  Pelvic exam and Pap test. This may be done every 3 years starting at age 21. Starting at age 30, this may be done every 5 years if you have a Pap test in combination with an HPV test. Talk with your health care provider about your test results, treatment options, and if necessary, the need for more tests.   Follow these instructions at home: °Eating and drinking ° °· Eat a diet that includes fresh fruits and vegetables, whole grains, lean protein, and low-fat dairy. °· Take vitamin and mineral supplements as recommended by your health care provider. °· Do not drink alcohol if: °? Your health care provider tells you not to drink. °? You are  pregnant, may be pregnant, or are planning to become pregnant. °· If you drink alcohol: °? Limit how much you have to 0-1 drink a day. °? Be aware of how much alcohol is in your drink. In the [place] one drink equals one 12 oz bottle of beer (355 mL), one 5 oz glass of wine (148 mL), or one 1½ oz glass of hard liquor (44 mL). °Lifestyle °· Take daily care of your teeth and gums. °· Stay active. Exercise for at least 30 minutes on 5 or more days each week. °· Do not use any products that contain nicotine or tobacco, such as cigarettes, e-cigarettes, and chewing tobacco. If you need help quitting, ask your health care provider. °· If you are sexually active, practice safe sex. Use a condom or other form of birth control (contraception) in order to prevent pregnancy and STIs (sexually transmitted infections). If you plan to become pregnant, see your health care provider for a preconception visit. °What's next? °· Visit your health care provider once a year for a well check visit. °· Ask your health care provider how often you should have your eyes and teeth checked. °· Stay up to date on all vaccines. °This information is not intended to replace advice given to you by your health care provider. Make sure you discuss any questions you have with your health care provider. °Document Released: 08/28/2001 Document Revised: 03/13/2018 Document Reviewed: 03/13/2018 °Elsevier Patient Education © 2020 [employer] °Breast Self-Awareness °Breast self-awareness is knowing how your breasts look and feel. Doing breast self-awareness is important. It allows you to catch a breast problem early while it is still small and can be treated. All women should do breast self-awareness, including women who have had breast implants. Tell your doctor if you notice a change in your breasts. °What you need: °· A mirror. °· A well-lit room. °How to do a breast self-exam °A breast self-exam is one way to learn what is normal for your breasts and to  check for changes. To do a breast self-exam: °Look for changes ° °1. Take off all the clothes above your waist. °2. Stand in front of a mirror in a room with good lighting. °3. Put your hands on your hips. °4. Push your hands down. °5. Look at your breasts and nipples in the mirror to see if one breast or nipple looks different from the other. Check to see if: °? The shape of one breast is different. °? The size of one breast is different. °? There are wrinkles, dips, and bumps in one breast and not the other. °6. Look at each breast for changes in the skin, such as: °? Redness. °? Scaly areas. °7. Look for changes in your nipples, such as: °? Liquid around the nipples. °? Bleeding. °? Dimpling. °? Redness. °? A change in where the nipples are. °Feel for changes ° °1. Lie on your back on the floor. °2. Feel each breast. To do this, follow these steps: °? Pick a breast to feel. °? Put the arm closest to that breast above your head. °? Use your other arm to feel the nipple area of   your breast. Feel the area with the pads of your three middle fingers by making small circles with your fingers. For the first circle, press lightly. For the second circle, press harder. For the third circle, press even harder. ? Keep making circles with your fingers at the different pressures as you move down your breast. Stop when you feel your ribs. ? Move your fingers a little toward the center of your body. ? Start making circles with your fingers again, this time going up until you reach your collarbone. ? Keep making up-and-down circles until you reach your armpit. Remember to keep using the three pressures. ? Feel the other breast in the same way. 3. Sit or stand in the tub or shower. 4. With soapy water on your skin, feel each breast the same way you did in step 2 when you were lying on the floor. Write down what you find Writing down what you find can help you remember what to tell your doctor. Write down:  What is  normal for each breast.  Any changes you find in each breast, including: ? The kind of changes you find. ? Whether you have pain. ? Size and location of any lumps.  When you last had your menstrual period. General tips  Check your breasts every month.  If you are breastfeeding, the best time to check your breasts is after you feed your baby or after you use a breast pump.  If you get menstrual periods, the best time to check your breasts is 5-7 days after your menstrual period is over.  With time, you will become comfortable with the self-exam, and you will begin to know if there are changes in your breasts. Contact a doctor if you:  See a change in the shape or size of your breasts or nipples.  See a change in the skin of your breast or nipples, such as red or scaly skin.  Have fluid coming from your nipples that is not normal.  Find a lump or thick area that was not there before.  Have pain in your breasts.  Have any concerns about your breast health. Summary  Breast self-awareness includes looking for changes in your breasts, as well as feeling for changes within your breasts.  Breast self-awareness should be done in front of a mirror in a well-lit room.  You should check your breasts every month. If you get menstrual periods, the best time to check your breasts is 5-7 days after your menstrual period is over.  Let your doctor know of any changes you see in your breasts, including changes in size, changes on the skin, pain or tenderness, or fluid from your nipples that is not normal. This information is not intended to replace advice given to you by your health care provider. Make sure you discuss any questions you have with your health care provider. Document Released: 12/19/2007 Document Revised: 02/18/2018 Document Reviewed: 02/18/2018 Elsevier Patient Education  2020 [employer].  Primary Ovarian Insufficiency  Primary ovarian insufficiency is a condition in  which the ovaries stop working in women under age 14. The ovaries have a fixed number of eggs, which are stored in fluid-filled sacs (follicles). They also produce the female sex hormones, including estrogen. After puberty, female hormones trigger the release of an egg from a follicle (ovulation) each month. If the egg does not get fertilized by a sperm, a woman will have a menstrual period. Throughout life, the number of follicles in the ovaries slowly  declines. A condition called menopause occurs when there are no follicles left. When this occurs, ovulation stops and the level of estrogen drops. This is a natural process and occurs in all women by about age 73. If you have primary ovarian failure, the loss of follicles and ability to produce estrogen occurs at a much younger age. What are the causes? In most cases, the exact cause of this condition is not known. Some known causes include:  Not having enough follicles. You are born with a certain number of follicles. You may not have enough to last 40 or more years.  Cancer treatments that damage follicles. These include medicines (chemotherapy), high energy waves (radiation), or surgery.  Follicles that do not respond to the brain hormone that tells follicles to release eggs (follicle-stimulating hormone or FSH).  Having a genetic disorder that causes abnormal ovaries (Turner syndrome and fragile X syndrome).  Having a condition in which your immune system attacks your ovaries (autoimmune disease). What increases the risk? You are more likely to develop this condition if:  You smoke.  You have diabetes.  You have a family history of primary ovarian insufficiency. What are the symptoms? Symptoms of this condition include:  Inability to get pregnant.  Irregular or missed periods.  Other symptoms can be caused by low levels of estrogen. They include: ? Night sweats. ? Hot flashes. ? Dry vagina, which can cause pain during sex. ? Loss  of interest in sex (low libido). ? Irritability. ? Trouble sleeping. ? Confusion. ? Thinning bones (osteoporosis), which may cause bones to break easily. In many cases, there are no symptoms for this condition. How is this diagnosed? This condition may be diagnosed based on:  Your symptoms. Your health care provider will ask you questions about your symptoms. He or she may suspect primary ovarian insufficiency if you have trouble getting pregnant or if you have irregular or missed periods.  A physical exam.  Blood tests. This is done to confirm the diagnosis. The test will check for: ? Low levels of estrogen. ? High levels of FSH. Follicle loss and low levels of estrogen will cause your brain to release more FSH. ? Low levels of anti-Mullerian hormone (AMH). As follicles are lost, this hormone also decreases. If you have low estrogen associated with low AMH and high FSH, your health care provider may do more tests to look for a possible cause of your primary ovarian insufficiency. These may include genetic testing and testing for autoimmune disease. How is this treated? This condition is treated with hormone replacement therapy, which replaces the female hormones estrogen and progesterone. These hormones may be given as oral medicines or as a skin patch. This treatment may:  Relieve the symptoms of estrogen deficiency.  Prevent osteoporosis.  Protect from heart disease. Treatment often goes on until around age 20 when menopause usually occurs. There is no treatment that can fully restore fertility. However, a small number of women can get pregnant after being diagnosed with this condition. If you desire to get pregnant now or in the future, talk to a fertility specialist right away about your options. Follow these instructions at home:   Take over-the-counter and prescription medicines only as told by your health care provider.  Ask your health care provider about the risks and side  effects of hormone replacement therapy.  Eat foods rich in calcium and vitamin D. These include milk, other dairy products, and orange juice or breakfast cereals that have vitamin D added (  fortified). These may help to prevent osteoporosis.  Do exercise regularly. This can make your muscles and bones stronger. Ask your health care provider which activities are safe for you.  Do not use any products that contain nicotine or tobacco, such as cigarettes and e-cigarettes. If you need help quitting, ask your health care provider.  Keep all follow-up visits as told by your health care provider. This is important. Contact a health care provider if:  You have symptoms of low estrogen.  Your periods stop or are not regular.  You have side effects from your medicines. Get help right away if:  You break (fracture) a bone.  You have chest pain or trouble breathing. Summary  Primary ovarian insufficiency results when your ovaries stop working normally before age 61.  In most cases, the exact cause of this condition is not known.  The first sign of this disorder may be difficulty getting pregnant.  Symptoms of this condition include irregular or missed periods and symptoms of low estrogen.  This condition is treated with hormone replacement therapy. This reduces symptoms of low estrogen and protects from osteoporosis. This information is not intended to replace advice given to you by your health care provider. Make sure you discuss any questions you have with your health care provider. Document Released: 10/09/2017 Document Revised: 10/24/2018 Document Reviewed: 10/09/2017 Elsevier Patient Education  2020 [employer].

## 2019-01-21 NOTE — Progress Notes (Signed)
Pt is present for annual exam. Pt's last pap was 08/01/17 results normal. Pt stated that she was doing well.

## 2019-01-21 NOTE — Telephone Encounter (Signed)
[facility] for prescreening/ sent mychart message.

## 2019-01-22 ENCOUNTER — Other Ambulatory Visit: Payer: Self-pay

## 2019-01-22 ENCOUNTER — Ambulatory Visit (INDEPENDENT_AMBULATORY_CARE_PROVIDER_SITE_OTHER): Payer: Medicaid Other | Admitting: Certified Nurse Midwife

## 2019-01-22 ENCOUNTER — Encounter: Payer: Self-pay | Admitting: Certified Nurse Midwife

## 2019-01-22 ENCOUNTER — Other Ambulatory Visit (HOSPITAL_COMMUNITY)
Admission: RE | Admit: 2019-01-22 | Discharge: 2019-01-22 | Disposition: A | Discharge status: Home / Self Care | Payer: Medicaid Other | Source: Ambulatory Visit | Attending: Certified Nurse Midwife | Admitting: Certified Nurse Midwife

## 2019-01-22 VITALS — BP 116/73 | HR 69 | Ht 65.0 in | Wt 201.4 lb

## 2019-01-22 DIAGNOSIS — Z124 Encounter for screening for malignant neoplasm of cervix: Secondary | ICD-10-CM | POA: Diagnosis present

## 2019-01-22 DIAGNOSIS — Z0000 Encounter for general adult medical examination without abnormal findings: Secondary | ICD-10-CM | POA: Diagnosis not present

## 2019-01-22 DIAGNOSIS — E288 Other ovarian dysfunction: Secondary | ICD-10-CM

## 2019-01-22 DIAGNOSIS — N760 Acute vaginitis: Secondary | ICD-10-CM

## 2019-01-22 DIAGNOSIS — Z01419 Encounter for gynecological examination (general) (routine) without abnormal findings: Secondary | ICD-10-CM | POA: Diagnosis present

## 2019-01-22 DIAGNOSIS — R87615 Unsatisfactory cytologic smear of cervix: Secondary | ICD-10-CM | POA: Insufficient documentation

## 2019-01-22 DIAGNOSIS — B9689 Other specified bacterial agents as the cause of diseases classified elsewhere: Secondary | ICD-10-CM

## 2019-01-22 DIAGNOSIS — E2839 Other primary ovarian failure: Secondary | ICD-10-CM

## 2019-01-22 MED ORDER — METRONIDAZOLE 0.75 % VA GEL: 1.0000 | Freq: Every day | VAGINAL | 1 refills | Status: DC

## 2019-01-22 NOTE — Progress Notes (Signed)
ANNUAL PREVENTATIVE CARE GYN  ENCOUNTER NOTE  Subjective:       Yvonne Black is a 33 y.o. [id number] female here for a routine annual gynecologic exam.  Current complaints: 1. Needs Pap smear due to insufficient cells on previous exam 2. Here to discuss results of labs and ultrasound  Denies difficulty breathing or respiratory distress chest, chest pain, abdominal pain, excessive vaginal bleeding, dysuria, and leg pain or swelling.    Gynecologic History  Patient's last menstrual period was 09/01/2018.  Contraception: none  Last Pap: 07/2017. Results were: Insufficient cells/HPV negative  Obstetric History  OB History  Gravida Para Term Preterm AB Living  2 1 1   1 2   SAB TAB Ectopic Multiple Live Births  1     1 2     # Outcome Date GA Lbr Len/2nd Weight Sex Delivery Anes PTL Lv  2A Term 05/03/10   4 lb 6 oz (1.984 kg) M CS-LTranv   LIV  2B Term 05/03/10   4 lb 4 oz (1.928 kg) M CS-LTranv Spinal N LIV  1 SAB 2008            Past Medical History:  Diagnosis Date  . Hormone disorder     Past Surgical History:  Procedure Laterality Date  . BREAST SURGERY  12/19/2016   Reduction  . CESAREAN SECTION  05/03/2010  . CYST REMOVAL HAND    . tummy tuck  12/19/2016    Allergies  Allergen Reactions  . Iodine Anaphylaxis  . Latex Hives  . Penicillins     Social History   Socioeconomic History  . Marital status: Married    Spouse name: Not on file  . Number of children: Not on file  . Years of education: Not on file  . Highest education level: Not on file  Occupational History  . Not on file  Social Needs  . Financial resource strain: Not on file  . Food insecurity    Worry: Not on file    Inability: Not on file  . Transportation needs    Medical: Not on file    Non-medical: Not on file  Tobacco Use  . Smoking status: Never Smoker  . Smokeless tobacco: Never Used  Substance and Sexual Activity  . Alcohol use: No    Frequency: Never  . Drug use: No  .  Sexual activity: Yes    Partners: Male    Birth control/protection: None, Condom  Lifestyle  . Physical activity    Days per week: 0 days    Minutes per session: 0 min  . Stress: Only a little  Relationships  . Social Musicianconnections    Talks on phone: More than three times a week    Gets together: Never    Attends religious service: Never    Active member of club or organization: Yes    Attends meetings of clubs or organizations: More than 4 times per year    Relationship status: Married  . Intimate partner violence    Fear of current or ex partner: No    Emotionally abused: No    Physically abused: No    Forced sexual activity: No  Other Topics Concern  . Not on file  Social History Narrative  . Not on file    Family History  Problem Relation Age of Onset  . Breast cancer Other 50  . Diabetes Maternal Grandmother   . Hypertension Maternal Grandmother   . Diabetes Maternal Grandfather   . Hypertension  Maternal Grandfather   . Healthy Mother   . Healthy Father   . Ovarian cancer Neg Hx   . Colon cancer Neg Hx     The following portions of the patient's history were reviewed and updated as appropriate: allergies, current medications, past family history, past medical history, past social history, past surgical history and problem list.  Review of Systems  ROS negative except as noted above. Information obtained from patient.    Objective:   BP 116/73   Pulse 69   Ht  (1.651 m)   Wt 201 lb 6.4 oz (91.4 kg)   LMP 09/01/2018   BMI 33.51 kg/m    CONSTITUTIONAL: Well-developed, well-nourished female in no acute distress.   PSYCHIATRIC: Normal mood and affect. Normal behavior. Normal judgment and thought content.  NEUROLGIC: Alert and oriented to person, place, and time. Normal muscle tone coordination. No cranial nerve deficit noted.  HENT:  Normocephalic, atraumatic, External right and left ear normal. Oropharynx is clear and moist  EYES: Conjunctivae and  EOM are normal. Pupils are equal and round.   NECK: Normal range of motion, supple, no masses.  Normal thyroid.   SKIN: Skin is warm and dry. No rash noted. Not diaphoretic. No erythema. No pallor.  CARDIOVASCULAR: Normal heart rate noted, regular rhythm, no murmur.  RESPIRATORY: Clear to auscultation bilaterally. Effort and breath sounds normal, no problems with respiration noted.  BREASTS: Symmetric in size. No masses, skin changes, nipple  drainage, or lymphadenopathy.  ABDOMEN: Soft, normal bowel sounds, no distention noted.  No tenderness, rebound or guarding.   PELVIC:  External Genitalia: Normal  Vagina: Normal  Cervix: Normal, Pap collected  Uterus: Normal  Adnexa: Normal  MUSCULOSKELETAL: Normal range of motion. No tenderness.  No cyanosis, clubbing, or edema.  2+ distal pulses.  LYMPHATIC: No Axillary, Supraclavicular, or Inguinal Adenopathy.  Recent Results (from the past 2160 hour(s))  Cervicovaginal ancillary only     Status: Abnormal   Collection Time: 01/09/19 12:00 AM  Result Value Ref Range   Bacterial vaginitis **POSITIVE for Gardnerella vaginalis** (A)     Comment: Normal Reference Range - Negative   Candida vaginitis Negative for Candida species     Comment: Normal Reference Range - Negative   Trichomonas Negative     Comment: Normal Reference Range - Negative  POCT urine pregnancy     Status: None   Collection Time: 01/09/19  8:31 AM  Result Value Ref Range   Preg Test, Ur Negative Negative  FSH/LH     Status: None   Collection Time: 01/09/19  9:14 AM  Result Value Ref Range   LH 18.5 mIU/mL    Comment:                     Adult Female:                       Follicular phase      2.4 -  12.6                       Ovulation phase      14.0 -  95.6                       Luteal phase          1.0 -  11.4  Postmenopausal        7.7 -  58.5    FSH 23.1 mIU/mL    Comment:                     Adult Female:                        Follicular phase      3.5 -  12.5                       Ovulation phase       4.7 -  21.5                       Luteal phase          1.7 -   7.7                       Postmenopausal       25.8 - 134.8   Beta hCG quant (ref lab)     Status: None   Collection Time: 01/09/19  9:14 AM  Result Value Ref Range   hCG Quant <1 mIU/mL    Comment:                      Female (Non-pregnant)    0 -     5                             (Postmenopausal)  0 -     8                      Female (Pregnant)                      Weeks of Gestation                              3                6 -    71                              4               10 -   750                              5              217 -  7138                              6              158 - [id number]                              7             [id number] [id number]  [place]                             18             8099 - [id number] Roche E CLIA methodology   Thyroid Panel With TSH     Status: None   Collection Time: 01/09/19  9:14 AM  Result Value Ref Range   TSH 1.250 0.450 - 4.500 uIU/mL   T4, Total 6.6 4.5 - 12.0 ug/dL   T3 Uptake Ratio 24 24 - 39 %   Free Thyroxine Index 1.6 1.2 - 4.9   ULTRASOUND REPORT  Location: [facility]  Date of Service: 01/20/2019     Indications:Irregular bleeding. Findings:  The uterus retroverted and measures 6.8 x 3.0 x 4.9 cm. Echo texture is homogenous without evidence of focal masses.  The Endometrium measures 3 mm.  Right Ovary measures 2.4 x 1.3 x 1.4 cm. It is  normal in appearance.1.2 cm follicle. Left Ovary measures 1.5 x 1.0 x 1.0 cm. It is normal in appearance. Survey of the adnexa demonstrates no adnexal masses. There is no free fluid in the cul de sac.  Impression: 1. Retroverted Uterus 2. Small Rt Ovarian follicle.  Recommendations: 1.Clinical correlation with the patient's History and Physical Exam.   Assessment:   Annual gynecologic examination 33 y.o.   Contraception: none   Obesity 1   Problem List Items Addressed This Visit      Endocrine   Premature ovarian failure    Other Visit Diagnoses    Encounter for well woman exam with routine gynecological exam    -  Primary   Relevant Orders   Cytology - PAP   Screening for cervical cancer       Relevant Orders   Cytology - PAP   Encounter for repeat Pap smear due to previous insufficient cervical cells       Relevant Orders   Cytology - PAP      Plan:   Lab findings and ultrasound results reviewed with patient, verbalized understanding  Declines OCPs or hormonal management at this time  Prefers option of having pregnancy if desired; referral to Reproductive Endocrinology, see orders  Pap: Pap Co Test  Routine preventative health maintenance measures emphasized: Exercise/Diet/Weight control, Tobacco Warnings, Alcohol/Substance use risks, Stress Management and Peer Pressure Issues; see AVS  Reviewed red flag symptoms and when to call  RTC x 1 year for ANNUAL EXAM or sooner if needed   Gunnar BullaJenkins Michelle Roxane Puerto, CNM [facility], [facility] 01/22/19 10:09 AM

## 2019-01-25 NOTE — Telephone Encounter (Signed)
ERROR

## 2019-01-27 LAB — CYTOLOGY - PAP
Diagnosis: NEGATIVE
HPV: NOT DETECTED

## 2019-03-18 ENCOUNTER — Ambulatory Visit (INDEPENDENT_AMBULATORY_CARE_PROVIDER_SITE_OTHER)
Admission: RE | Admit: 2019-03-18 | Discharge: 2019-03-18 | Disposition: A | Discharge status: Home / Self Care | Payer: Medicaid Other | Source: Ambulatory Visit

## 2019-03-18 ENCOUNTER — Encounter: Payer: Self-pay | Admitting: Certified Nurse Midwife

## 2019-03-18 DIAGNOSIS — K219 Gastro-esophageal reflux disease without esophagitis: Secondary | ICD-10-CM | POA: Diagnosis not present

## 2019-03-18 MED ORDER — PANTOPRAZOLE SODIUM 40 MG PO TBEC: 40.0000 mg | DELAYED_RELEASE_TABLET | Freq: Every day | ORAL | 1 refills | Status: DC

## 2019-03-18 NOTE — Discharge Instructions (Addendum)
Take medication as prescribed. Important to keep a symptom log to bring to your PCP appointment. Return for worsening reflux, vomiting, nausea, decreased appetite, chest pain, difficulty breathing.

## 2019-03-18 NOTE — ED Provider Notes (Signed)
Virtual Visit via Video Note:  Yvonne Black  initiated request for Telemedicine visit with [facility] team. I connected with Yvonne Black  on 03/18/2019 at 11:01 AM  for a synchronized telemedicine visit using a video enabled HIPPA compliant telemedicine application. I verified that I am speaking with Yvonne Black  using two identifiers. [place] Hall-Potvin, PA-C  was physically located in a [facility] site and Areal Cochrane was located at a different location.   The limitations of evaluation and management by telemedicine as well as the availability of in-person appointments were discussed. Patient was informed that she  may incur a bill ( including co-pay) for this virtual visit encounter. Yvonne Black  expressed understanding and gave verbal consent to proceed with virtual visit.     History of Present Illness:Yvonne Black  is a 33 y.o. female presents with acute concern for reflux for the last few months.  States that she will get epigastric discomfort occasionally, worse at night patient, with retrosternal burning.  She has been taking Tums/Rolaids for with moderate relief of symptoms.  Patient does not currently have primary care, has never been evaluated by GI.  Denies decreased appetite, early satiety, choking, odynophagia, dysphagia, nausea, vomiting, diarrhea, blood in stool, chest pain, shortness of breath, cough, fever.  Past Medical History:  Diagnosis Date  . Hormone disorder     Allergies  Allergen Reactions  . Iodine Anaphylaxis  . Latex Hives  . Penicillins         Observations/Objective: 33 year old female Sitting in no acute distress.  Patient is able to speak in full sentences without coughing, sneezing, wheezing.  Assessment and Plan: GERD History consistent with GERD: Will trial 35-month course of PPI while patient awaits new patient appointment with PCP.  Will discontinue antacid use to avoid hypercalcemia during PPI trial.  Patient to  keep food/symptom log to bring to PCP appointment.  Return precautions discussed, patient verbalized understanding and is agreeable to plan.  Follow Up Instructions: Patient to schedule new patient appointment with new PCP for further surveillance/evaluation/management   I discussed the assessment and treatment plan with the patient. The patient was provided an opportunity to ask questions and all were answered. The patient agreed with the plan and demonstrated an understanding of the instructions.   The patient was advised to call back or seek an in-person evaluation if the symptoms worsen or if the condition fails to improve as anticipated.  I provided 20 minutes of non-face-to-face time during this encounter.    [place] Hall-Potvin, PA-C  03/18/2019 11:01 AM        Hall-Potvin, [place], PA-C 03/18/19 1215

## 2019-03-26 ENCOUNTER — Ambulatory Visit: Payer: Medicaid Other | Admitting: Physician Assistant

## 2019-03-26 ENCOUNTER — Other Ambulatory Visit: Payer: Self-pay

## 2019-03-26 ENCOUNTER — Encounter: Payer: Self-pay | Admitting: Physician Assistant

## 2019-03-26 DIAGNOSIS — Z5309 Procedure and treatment not carried out because of other contraindication: Secondary | ICD-10-CM

## 2019-03-26 DIAGNOSIS — Z113 Encounter for screening for infections with a predominantly sexual mode of transmission: Secondary | ICD-10-CM | POA: Diagnosis not present

## 2019-03-26 NOTE — Progress Notes (Signed)
Patient into clinic requesting testing for STD.  Reports that she is using vaginal cream/gel to treat BV and has 3-4 more days of treatment to complete the treatment.  Counseled that she should not have used any cream, suppository or douche for at least 72 hr before a pelvic exam for accurate results.  Patient opts to reschedule and given an appt for 04/06/2019 at 2 pm.

## 2019-04-06 ENCOUNTER — Ambulatory Visit: Payer: Medicaid Other

## 2019-04-20 ENCOUNTER — Other Ambulatory Visit: Payer: Self-pay

## 2019-04-20 DIAGNOSIS — Z20828 Contact with and (suspected) exposure to other viral communicable diseases: Secondary | ICD-10-CM

## 2019-04-20 DIAGNOSIS — Z20822 Contact with and (suspected) exposure to covid-19: Secondary | ICD-10-CM

## 2019-04-22 LAB — NOVEL CORONAVIRUS, NAA: SARS-CoV-2, NAA: NOT DETECTED

## 2019-09-14 ENCOUNTER — Ambulatory Visit: Payer: Medicaid Other | Attending: Internal Medicine

## 2019-09-14 DIAGNOSIS — U071 COVID-19: Secondary | ICD-10-CM

## 2019-09-15 ENCOUNTER — Ambulatory Visit: Payer: Medicaid Other | Attending: Internal Medicine

## 2019-09-15 LAB — NOVEL CORONAVIRUS, NAA: SARS-CoV-2, NAA: NOT DETECTED

## 2019-09-15 LAB — SPECIMEN STATUS REPORT

## 2019-10-09 ENCOUNTER — Encounter: Payer: Self-pay | Admitting: Certified Nurse Midwife

## 2019-10-09 ENCOUNTER — Other Ambulatory Visit: Payer: Self-pay

## 2019-10-09 ENCOUNTER — Ambulatory Visit (INDEPENDENT_AMBULATORY_CARE_PROVIDER_SITE_OTHER): Payer: Medicaid Other | Admitting: Certified Nurse Midwife

## 2019-10-09 VITALS — BP 118/73 | HR 82 | Ht 65.0 in | Wt 206.6 lb

## 2019-10-09 DIAGNOSIS — Z3009 Encounter for other general counseling and advice on contraception: Secondary | ICD-10-CM | POA: Diagnosis not present

## 2019-10-09 DIAGNOSIS — R635 Abnormal weight gain: Secondary | ICD-10-CM

## 2019-10-09 DIAGNOSIS — Z98891 History of uterine scar from previous surgery: Secondary | ICD-10-CM

## 2019-10-09 DIAGNOSIS — R232 Flushing: Secondary | ICD-10-CM

## 2019-10-09 DIAGNOSIS — Z6834 Body mass index (BMI) 34.0-34.9, adult: Secondary | ICD-10-CM

## 2019-10-09 DIAGNOSIS — Z23 Encounter for immunization: Secondary | ICD-10-CM

## 2019-10-09 DIAGNOSIS — Z862 Personal history of diseases of the blood and blood-forming organs and certain disorders involving the immune mechanism: Secondary | ICD-10-CM

## 2019-10-09 DIAGNOSIS — Z6831 Body mass index (BMI) 31.0-31.9, adult: Secondary | ICD-10-CM | POA: Insufficient documentation

## 2019-10-09 DIAGNOSIS — N3941 Urge incontinence: Secondary | ICD-10-CM

## 2019-10-09 MED ORDER — TETANUS-DIPHTH-ACELL PERTUSSIS 5-2.5-18.5 LF-MCG/0.5 IM SUSP
0.5000 mL | Freq: Once | INTRAMUSCULAR | Status: AC
  Administered 2019-10-09: 0.5 mL via INTRAMUSCULAR

## 2019-10-09 NOTE — Patient Instructions (Signed)
Intrauterine Device Insertion An intrauterine device (IUD) is a medical device that gets inserted into the uterus to prevent pregnancy. It is a small, T-shaped device that has one or two nylon strings hanging down from it. The strings hang out of the lower part of the uterus (cervix) to allow for future IUD removal. There are two types of IUDs available:  Copper IUD. This type of IUD has copper wire wrapped around it. Copper makes the uterus and fallopian tubes produce a fluid that kills sperm. A copper IUD may last up to 10 years.  Hormone IUD. This type of IUD is made of plastic and contains the hormone progestin (synthetic progesterone). The hormone thickens mucus in the cervix and prevents sperm from entering the uterus. It also thins the uterine lining to prevent implantation of a fertilized egg. The hormone can weaken or kill the sperm that get into the uterus. A hormone IUD may last 3-5 years. Tell a health care provider about:  Any allergies you have.  All medicines you are taking, including vitamins, herbs, eye drops, creams, and over-the-counter medicines.  Any problems you or family members have had with anesthetic medicines.  Any blood disorders you have.  Any surgeries you have had.  Any medical conditions you have, including any STIs (sexually transmitted infections) you may have.  Whether you are pregnant or may be pregnant. What are the risks? Generally, this is a safe procedure. However, problems may occur, including:  Infection.  Bleeding.  Allergic reactions to medicines.  Accidental puncture (perforation) of the uterus, or damage to other structures or organs.  Accidental placement of the IUD either in the muscle layer of the uterus (myometrium) or outside the uterus.  The IUD falling out of the uterus (expulsion). This is more common among women who have recently had a child.  Pregnancy that happens in the fallopian tube (ectopic pregnancy).  Infection of  the uterus and fallopian tubes (pelvic inflammatory disease). What happens before the procedure?  Schedule the IUD insertion for when you will have your menstrual period or right after, to make sure you are not pregnant. Placement of the IUD is better tolerated shortly after a menstrual cycle.  Follow instructions from your health care provider about eating or drinking restrictions.  Ask your health care provider about changing or stopping your regular medicines. This is especially important if you are taking diabetes medicines or blood thinners.  You may get a pain reliever to take before the procedure.  You may have tests for: ? Pregnancy. A pregnancy test involves having a urine sample taken. ? STIs. Placing an IUD in someone who has an STI can make the infection worse. ? Cervical cancer. You may have a Pap test to check for this type of cancer. This means collecting cells from your cervix to be examined under a microscope.  You may have a physical exam to determine the size and position of your uterus. The procedure may vary among health care providers and hospitals. What happens during the procedure?  A tool (speculum) will be placed in your vagina and widened so that your health care provider can see your cervix.  Medicine may be applied to your cervix to help lower your risk of infection (antiseptic medicine).  You may be given an anesthetic medicine to numb each side of your cervix (intracervical block or paracervical block). This medicine is usually given by an injection into the cervix.  A tool (uterine sound) will be inserted into your   uterus to determine the length of your uterus and the direction that your uterus may be tilted.  A slim instrument or tube (IUD inserter) that holds the IUD will be inserted into your vagina, through your cervical canal, and into your uterus.  The IUD will be placed in the uterus, and the IUD inserter will be removed.  The strings that are  attached to the IUD will be trimmed so that they lie just below the cervix. The procedure may vary among health care providers and hospitals. What happens after the procedure?  You may have bleeding after the procedure. This is normal. It varies from light bleeding (spotting) for a few days to menstrual-like bleeding.  You may have cramping and pain.  You may feel dizzy or light-headed.  You may have lower back pain. Summary  An intrauterine device (IUD) is a small, T-shaped device that has one or two nylon strings hanging down from it.  Two types of IUDs are available. You may have a copper IUD or a hormone IUD.  Schedule the IUD insertion for when you will have your menstrual period or right after, to make sure you are not pregnant. Placement of the IUD is better tolerated shortly after a menstrual cycle.  You may have bleeding after the procedure. This is normal. It varies from light spotting for a few days to menstrual-like bleeding. This information is not intended to replace advice given to you by your health care provider. Make sure you discuss any questions you have with your health care provider. Document Revised: 06/14/2017 Document Reviewed: 05/23/2016 Elsevier Patient Education  2020 [employer].   Spermicide Use Spermicide is a method of preventing pregnancy (contraception). It is a barrier type of contraception. It works by blocking sperm from reaching an egg. This prevents pregnancy from taking place. Spermicides are available as:  Creams.  Foams.  Films.  Sponges.  Suppositories. Spermicides work by killing or deactivating sperm before they enter the uterus. Women release an egg (ovulate) each month during their menstrual cycle. The egg can be fertilized by a sperm following sexual intercourse. A spermicide may prevent any sperm from entering the opening to the woman's uterus (cervix) to reach an egg. You do not need to see your health care provider to get  spermicide. It can be bought over the counter. Using spermicide does not affect a woman's menstrual cycle. Spermicide is much less effective than other barrier methods of preventing pregnancy. It may be more effective if it is used in combination with a condom, diaphragm, or cervical cap. Spermicide does not protect against sexually transmitted infections (STIs). What are the risks? Risks may include:  Pregnancy, especially when a spermicide is used as the only method of preventing pregnancy.  STIs.  An allergic reaction. This may cause itching or burning in the vagina.  Irritation of the vagina or penis, which may develop over time.  Infection of the vagina. This may be caused by irritation or an allergic reaction. How to use spermicide Different types of spermicides are used differently. You should follow the instructions on the spermicide package. In general: 1. Wash your hands with soap and water before each use. 2. Dry your hands with a clean towel. Make sure your fingers are dry before inserting spermicide films. 3. Insert the spermicide deep into your vagina before you have sex. ? Use the included applicator to insert spermicide foams, creams, and jellies. ? Directly insert spermicide sponges, suppositories, and films into your vagina. 4.  Wait for the amount of time shown on the package before you have sex. Do not have sex right away. Only use each application of spermicide once. Insert a new one every time you have sex. Do not douche or rinse your vagina for at least 6-8 hours after sex, or as told by your health care provider. Contact a health care provider if you:  Develop soreness, redness, or itching in or around your vagina.  Have bad-smelling discharge from your vagina.  Have pain during intercourse when using spermicide.  Have blood in your urine, or burning or pain when you urinate.  Think you may be pregnant. Summary  Spermicide is a method of preventing  pregnancy (contraception). It is a barrier type of contraception.  You do not need to see your health care provider to get spermicide. It can be bought over the counter.  Spermicide is much less effective than other barrier methods of contraception. It may be more effective when used with another method of contraception, such as a condom.  Only use each application of spermicide once. This information is not intended to replace advice given to you by your health care provider. Make sure you discuss any questions you have with your health care provider. Document Revised: 10/23/2018 Document Reviewed: 08/14/2017 Elsevier Patient Education  2020 [employer] Inc[employer]   Diaphragm Information and Use  A diaphragm is a soft, latex or silicone dome-shaped barrier that is placed in the vagina with sperm-killing (spermicidal) jelly before sex. It covers the cervix, kills sperm, and blocks the passage of sperm into the cervix. This method does not protect against STIs (sexually transmitted infections). The diaphragm can be inserted up to 2 hours before sex. If it is inserted more than 2 hours before sex, the spermicide must be applied again. A diaphragm must be fitted by a health care provider during a pelvic exam. The health care provider will measure your vagina before prescribing a diaphragm. During the exam, you will also learn about the use and care of a diaphragm as well as possible problems. It is important to have the diaphragm rechecked and possibly refitted if you:  Become pregnant.  Have significant weight changes (you gain or lose 20% of your body weight), such as gaining or losing 32 lb (14 kg) after weighing 160 lb (72 kg). The diaphragm should be replaced every 2 years, or sooner if damaged. What are the advantages of using a diaphragm?  You can use it while breastfeeding.  It is not felt by your sexual partner.  It does not interfere with your female hormones.  It works immediately and  is not permanent.  It has few side effects or risks associated with its use.  When used properly, it is safe and effective. What are the disadvantages of using a diaphragm?  It is sometimes difficult to insert.  It may shift out of place during sex.  It requires a prescription, and you must have it fitted and refitted by your health care provider.  It may increase the risk of urinary tract infections.  There is an increased risk of toxic shock syndrome if it is left in place for more than 24 hours.  It should not be used during your menstrual period.  It may cause you pain or discomfort during sex.  It does not protect against STIs, including HIV (human immunodeficiency virus). Using a condom is recommended to reduce these risks. How to insert a diaphragm  1. Empty your bladder by urinating  before you insert the diaphragm. 2. Wash your hands with soap and water before inserting the diaphragm. If soap and water are not available, use hand sanitizer. 3. Check the diaphragm for holes by holding it up to the light, stretching the latex, or filling it with water. 4. Place the spermicide cream or jelly inside the dome and around the rim of the diaphragm. 5. Squeeze the rim of the diaphragm and insert the diaphragm into the vagina. ? The opening of the dome should face the cervix while you are inserting the diaphragm. ? The front part (or top) of the rim should be behind the pubic bone and pushed over the top of the cervix. 6. Be sure the cervix is completely covered. Do this by reaching into your vagina and feeling the cervix behind the latex dome of the diaphragm. If you are uncomfortable, then it is not inserted properly. Try inserting it again. 7. Leave the diaphragm in for 6-8 hours after having sex. Before sex can occur again during these 6 hours, spermicide must be reapplied. 8. Do not douche with the diaphragm in place. 9. The diaphragm should not be left in place for longer than  24 hours. 10. To remove the diaphragm, insert a finger into your vagina and slip it under the rim. Then slide the diaphragm out gently. Follow these instructions at home:  Wash the diaphragm with mild soap and warm water. Rinse it thoroughly and dry it completely after every use.  Only use water-based lubricants with the diaphragm. Oil-based lubricants can damage the diaphragm. Water-based lubricants do not contain silicone, wax, or oil.  Do not use talc on the diaphragm.  Do not use the diaphragm if: ? You had a baby in the last 2 months. ? You have a vaginal infection. ? You are having a menstrual period. ? You had a recent surgery on your cervix or vagina. ? You have vaginal bleeding of unknown cause. ? Your sexual partner is allergic to latex or spermicides. Contact a health care provider if:  You have pain during sexual intercourse when using the diaphragm.  The diaphragm slips out of place during sex.  You have blood in your urine.  You have burning or pain when you urinate.  You find a hole in the diaphragm.  You develop abnormal vaginal discharge.  You have itching or irritation in your vagina.  You cannot remove the diaphragm.  You think you may be pregnant.  You need to be refitted for a diaphragm. Summary  A diaphragm is a soft, latex or silicone dome-shaped barrier that is placed in the vagina with sperm-killing (spermicidal) jelly before sex.  This method does not protect against STIs (sexually transmitted infections).  A diaphragm requires a prescription, and you must have it fitted and refitted by your health care provider.  The diaphragm can be inserted up to 2 hours before sex.  Leave the diaphragm in for 6-8 hours after having sex. Before sex can occur again during these 6 hours, spermicide must be reapplied. This information is not intended to replace advice given to you by your health care provider. Make sure you discuss any questions you have with  your health care provider. Document Revised: 10/22/2018 Document Reviewed: 05/21/2016 Elsevier Patient Education  2020 Else[employer] Inc.   Thyroid-Stimulating Hormone Test Why am I having this test? You may have a thyroid-stimulating hormone (TSH) test if you have possible symptoms of abnormal thyroid hormone levels. This test can help your health care  provider:  Diagnose a disorder of the thyroid gland or pituitary gland.  Manage your condition and treatment if you have an underactive thyroid (hypothyroidism) or an overactive thyroid (hyperthyroidism). Newborn babies may have this test done to screen for hypothyroidism that is present at birth (congenital). The thyroid is a gland in the lower front of the neck. It makes hormones that affect many body parts and systems, including the system that affects how quickly the body burns fuel for energy (metabolism). The pituitary gland is located just below the brain, behind the eyes and nasal passages. It helps maintain thyroid hormone levels and thyroid gland function. What is being tested? This test measures the amount of TSH in your blood. TSH may also be called thyrotropin. When the thyroid does not make enough hormones, the pituitary gland releases TSH into the bloodstream to stimulate the thyroid gland to make more hormones. What kind of sample is taken?     A blood sample is required for this test. It is usually collected by inserting a needle into a blood vessel. For newborns, a small amount of blood may be collected from the umbilical cord, or by using a small needle to prick the baby's heel (heel stick). Tell a health care provider about:  All medicines you are taking, including vitamins, herbs, eye drops, creams, and over-the-counter medicines.  Any blood disorders you have.  Any surgeries you have had.  Any medical conditions you have.  Whether you are pregnant or may be pregnant. How are the results reported? Your test results  will be reported as a value that indicates how much TSH is in your blood. Your health care provider will compare your results to normal ranges that were established after testing a large group of people (reference ranges). Reference ranges may vary among labs and hospitals. For this test, common reference ranges are:  Adult: 2-10 microunits/mL or 2-10 milliunits/L.  Newborn: ? Heel stick: 3-18 microunits/mL or 3-18 milliunits/L. ? Umbilical cord: 1-61 microunits/mL or 3-12 milliunits/L. What do the results mean? Results that are within the reference range are considered normal. This means that you have a normal amount of TSH in your blood. Results that are higher than the reference range mean that your TSH levels are too high. This may mean:  Your thyroid gland is not making enough thyroid hormones.  Your thyroid medicine dosage is too low.  You have a tumor on your pituitary gland. This is rare. Results that are lower than the reference range mean that your TSH levels are too low. This may be caused by hyperthyroidism or by a problem with the pituitary gland function. Talk with your health care provider about what your results mean. Questions to ask your health care provider Ask your health care provider, or the department that is doing the test:  When will my results be ready?  How will I get my results?  What are my treatment options?  What other tests do I need?  What are my next steps? Summary  You may have a thyroid-stimulating hormone (TSH) test if you have possible symptoms of abnormal thyroid hormone levels.  The thyroid is a gland in the lower front of the neck. It makes hormones that affect many body parts and systems.  The pituitary gland is located just below the brain, behind the eyes and nasal passages. It helps maintain thyroid hormone levels and thyroid gland function.  This test measures the amount of TSH in your blood. TSH is made  by the pituitary gland. It  may also be called thyrotropin. This information is not intended to replace advice given to you by your health care provider. Make sure you discuss any questions you have with your health care provider. Document Revised: 09/30/2017 Document Reviewed: 03/05/2017 Elsevier Patient Education  2020 [employer]   Perimenopause  Perimenopause is the normal time of life before and after menstrual periods stop completely (menopause). Perimenopause can begin 2-8 years before menopause, and it usually lasts for 1 year after menopause. During perimenopause, the ovaries may or may not produce an egg. What are the causes? This condition is caused by a natural change in hormone levels that happens as you get older. What increases the risk? This condition is more likely to start at an earlier age if you have certain medical conditions or treatments, including:  A tumor of the pituitary gland in the brain.  A disease that affects the ovaries and hormone production.  Radiation treatment for cancer.  Certain cancer treatments, such as chemotherapy or hormone (anti-estrogen) therapy.  Heavy smoking and excessive alcohol use.  Family history of early menopause. What are the signs or symptoms? Perimenopausal changes affect each woman differently. Symptoms of this condition may include:  Hot flashes.  Night sweats.  Irregular menstrual periods.  Decreased sex drive.  Vaginal dryness.  Headaches.  Mood swings.  Depression.  Memory problems or trouble concentrating.  Irritability.  Tiredness.  Weight gain.  Anxiety.  Trouble getting pregnant. How is this diagnosed? This condition is diagnosed based on your medical history, a physical exam, your age, your menstrual history, and your symptoms. Hormone tests may also be done. How is this treated? In some cases, no treatment is needed. You and your health care provider should make a decision together about whether treatment is  necessary. Treatment will be based on your individual condition and preferences. Various treatments are available, such as:  Menopausal hormone therapy (MHT).  Medicines to treat specific symptoms.  Acupuncture.  Vitamin or herbal supplements. Before starting treatment, make sure to let your health care provider know if you have a personal or family history of:  Heart disease.  Breast cancer.  Blood clots.  Diabetes.  Osteoporosis. Follow these instructions at home: Lifestyle  Do not use any products that contain nicotine or tobacco, such as cigarettes and e-cigarettes. If you need help quitting, ask your health care provider.  Eat a balanced diet that includes fresh fruits and vegetables, whole grains, soybeans, eggs, lean meat, and low-fat dairy.  Get at least 30 minutes of physical activity on 5 or more days each week.  Avoid alcoholic and caffeinated beverages, as well as spicy foods. This may help prevent hot flashes.  Get 7-8 hours of sleep each night.  Dress in layers that can be removed to help you manage hot flashes.  Find ways to manage stress, such as deep breathing, meditation, or journaling. General instructions  Keep track of your menstrual periods, including: ? When they occur. ? How heavy they are and how long they last. ? How much time passes between periods.  Keep track of your symptoms, noting when they start, how often you have them, and how long they last.  Take over-the-counter and prescription medicines only as told by your health care provider.  Take vitamin supplements only as told by your health care provider. These may include calcium, vitamin E, and vitamin D.  Use vaginal lubricants or moisturizers to help with vaginal dryness and improve comfort  during sex.  Talk with your health care provider before starting any herbal supplements.  Keep all follow-up visits as told by your health care provider. This is important. This includes any  group therapy or counseling. Contact a health care provider if:  You have heavy vaginal bleeding or pass blood clots.  Your period lasts more than 2 days longer than normal.  Your periods are recurring sooner than 21 days.  You bleed after having sex. Get help right away if:  You have chest pain, trouble breathing, or trouble talking.  You have severe depression.  You have pain when you urinate.  You have severe headaches.  You have vision problems. Summary  Perimenopause is the time when a woman's body begins to move into menopause. This may happen naturally or as a result of other health problems or medical treatments.  Perimenopause can begin 2-8 years before menopause, and it usually lasts for 1 year after menopause.  Perimenopausal symptoms can be managed through medicines, lifestyle changes, and complementary therapies such as acupuncture. This information is not intended to replace advice given to you by your health care provider. Make sure you discuss any questions you have with your health care provider. Document Revised: 06/14/2017 Document Reviewed: 08/07/2016 Elsevier Patient Education  2020 [employer].

## 2019-10-09 NOTE — Progress Notes (Signed)
GYN ENCOUNTER NOTE  Subjective:       Yvonne Black is a 34 y.o. G27P1012 female here for contraception counseling, considering Paragard due to lack of hormones.   Reports hot flashes since Nexplanon removal, weight gain despite exercise, and urge incontinence.   Denies difficulty breathing or respiratory distress, chest pain, abdominal pain, excessive vaginal bleeding, dysuria, and leg pain or swelling.    Gynecologic History  Patient's last menstrual period was 08/24/2019 (exact date).  Contraception: condoms  Last Pap: 01/2019. Results were: Neg/Neg  Obstetric History  OB History  Gravida Para Term Preterm AB Living  2 1 1   1 2   SAB TAB Ectopic Multiple Live Births  1     1 2     # Outcome Date GA Lbr Len/2nd Weight Sex Delivery Anes PTL Lv  2A Term 05/03/10   4 lb 6 oz (1.984 kg) M CS-LTranv   LIV  2B Term 05/03/10   4 lb 4 oz (1.928 kg) M CS-LTranv Spinal N LIV  1 SAB 2008            Past Medical History:  Diagnosis Date  . Hormone disorder     Past Surgical History:  Procedure Laterality Date  . BREAST SURGERY  12/19/2016   Reduction  . CESAREAN SECTION  05/03/2010  . CYST REMOVAL HAND    . tummy tuck  12/19/2016    Current Outpatient Medications on File Prior to Visit  Medication Sig Dispense Refill  . pantoprazole (PROTONIX) 40 MG tablet Take 1 tablet (40 mg total) by mouth daily. 30 tablet 1  . metroNIDAZOLE (METROGEL) 0.75 % vaginal gel Place 1 Applicatorful vaginally at bedtime. Apply one applicatorful to vagina at bedtime for 5 days (Patient not taking: Reported on 10/09/2019) 70 g 1   No current facility-administered medications on file prior to visit.    Allergies  Allergen Reactions  . Iodine Anaphylaxis  . Latex Hives  . Penicillins     Social History   Socioeconomic History  . Marital status: Married    Spouse name: Not on file  . Number of children: Not on file  . Years of education: Not on file  . Highest education level: Not on  file  Occupational History  . Not on file  Tobacco Use  . Smoking status: Never Smoker  . Smokeless tobacco: Never Used  Substance and Sexual Activity  . Alcohol use: No  . Drug use: No  . Sexual activity: Yes    Partners: Male    Birth control/protection: None, Condom  Other Topics Concern  . Not on file  Social History Narrative  . Not on file   Social Determinants of Health   Financial Resource Strain:   . Difficulty of Paying Living Expenses:   Food Insecurity:   . Worried About 02/18/2017 in the Last Year:   . 10/11/2019 in the Last Year:   Transportation Needs:   . Programme researcher, broadcasting/film/video (Medical):   Barista Lack of Transportation (Non-Medical):   Physical Activity:   . Days of Exercise per Week:   . Minutes of Exercise per Session:   Stress:   . Feeling of Stress :   Social Connections:   . Frequency of Communication with Friends and Family:   . Frequency of Social Gatherings with Friends and Family:   . Attends Religious Services:   . Active Member of Clubs or Organizations:   . Attends Freight forwarder Meetings:   .  Marital Status:   Intimate Partner Violence:   . Fear of Current or Ex-Partner:   . Emotionally Abused:   Marland Kitchen Physically Abused:   . Sexually Abused:     Family History  Problem Relation Age of Onset  . Breast cancer Other 50  . Diabetes Maternal Grandmother   . Hypertension Maternal Grandmother   . Diabetes Maternal Grandfather   . Hypertension Maternal Grandfather   . Healthy Mother   . Healthy Father   . Ovarian cancer Neg Hx   . Colon cancer Neg Hx     The following portions of the patient's history were reviewed and updated as appropriate: allergies, current medications, past family history, past medical history, past social history, past surgical history and problem list.  Review of Systems  ROS negative except as noted above. Information obtained from patient.   Objective:   BP 118/73   Pulse 82   Ht 5\' 5"   (1.651 m)   Wt 206 lb 9 oz (93.7 kg)   LMP 08/24/2019 (Exact Date)   BMI 34.37 kg/m   CONSTITUTIONAL: Well-developed, well-nourished female in no acute distress.   PHYSICAL EXAM: Not indicated.   Assessment:   1. Hot flashes  - CBC - Thyroid Panel With TSH - Ferritin  2. Weight gain  - Thyroid Panel With TSH  3. BMI 34.0-34.9,adult  - Thyroid Panel With TSH  4. History of anemia  - CBC - Ferritin  5. Encounter for counseling regarding contraception    Plan:   Reviewed all forms of birth control options available including abstinence; fertility period awareness methods; over the counter/barrier methods; hormonal contraceptive medication including pill, patch, ring, injection,contraceptive implant; hormonal and nonhormonal IUDs; permanent sterilization options including vasectomy and the various tubal sterilization modalities. Risks and benefits reviewed.  Questions were answered.  Information was given to patient to review.   Patient consider Verdia Kuba or Isla Pence IUD.   Labs today, see orders.   Medical weight loss information provided.   Referral to physical therapy, see orders.   Reviewed red flag symptoms and when to call.   RTC for IUD insertion or sooner if needed.    Diona Fanti, CNM [facility], [facility] 10/09/19 1:20 PM

## 2019-10-10 LAB — CBC
Hematocrit: 32.7 % — ABNORMAL LOW (ref 34.0–46.6)
Hemoglobin: 11 g/dL — ABNORMAL LOW (ref 11.1–15.9)
MCH: 27.6 pg (ref 26.6–33.0)
MCHC: 33.6 g/dL (ref 31.5–35.7)
MCV: 82 fL (ref 79–97)
Platelets: 252 10*3/uL (ref 150–450)
RBC: 3.98 x10E6/uL (ref 3.77–5.28)
RDW: 14 % (ref 11.7–15.4)
WBC: 5 10*3/uL (ref 3.4–10.8)

## 2019-10-10 LAB — THYROID PANEL WITH TSH
Free Thyroxine Index: 1.5 (ref 1.2–4.9)
T3 Uptake Ratio: 25 % (ref 24–39)
T4, Total: 5.8 ug/dL (ref 4.5–12.0)
TSH: 0.674 u[IU]/mL (ref 0.450–4.500)

## 2019-10-10 LAB — FERRITIN: Ferritin: 27 ng/mL (ref 15–150)

## 2019-10-10 MED ORDER — NYSTATIN 100000 UNIT/GM EX POWD: 1.0000 "application " | Freq: Three times a day (TID) | CUTANEOUS | 0 refills | Status: DC

## 2019-10-10 NOTE — Addendum Note (Signed)
Addended by: Shaune Spittle on: 10/10/2019 08:38 AM   Modules accepted: Orders

## 2019-10-23 ENCOUNTER — Other Ambulatory Visit: Payer: Self-pay

## 2019-10-23 ENCOUNTER — Ambulatory Visit (INDEPENDENT_AMBULATORY_CARE_PROVIDER_SITE_OTHER): Payer: Medicaid Other | Admitting: Certified Nurse Midwife

## 2019-10-23 ENCOUNTER — Encounter: Payer: Self-pay | Admitting: Certified Nurse Midwife

## 2019-10-23 VITALS — BP 125/84 | HR 83 | Ht 65.0 in | Wt 202.4 lb

## 2019-10-23 DIAGNOSIS — Z3202 Encounter for pregnancy test, result negative: Secondary | ICD-10-CM | POA: Diagnosis not present

## 2019-10-23 DIAGNOSIS — Z30430 Encounter for insertion of intrauterine contraceptive device: Secondary | ICD-10-CM

## 2019-10-23 LAB — POCT URINE PREGNANCY: Preg Test, Ur: NEGATIVE

## 2019-10-23 MED ORDER — PHENTERMINE HCL 37.5 MG PO CAPS: 37.5000 mg | ORAL_CAPSULE | ORAL | 0 refills | Status: DC

## 2019-10-23 NOTE — Progress Notes (Signed)
Yvonne Black is a 34 y.o. year old G36P1012 African American female who presents for placement of a Skyla IUD.  BP 125/84   Pulse 83   Ht 5\' 5"  (1.651 m)   Wt 202 lb 7 oz (91.8 kg)   LMP 10/15/2019 (Exact Date)   BMI 33.69 kg/m   The risks and benefits of the method and placement have been thouroughly reviewed with the patient and all questions were answered.  Specifically the patient is aware of failure rate of 07/998, expulsion of the IUD and of possible perforation.  The patient is aware of irregular bleeding due to the method and understands the incidence of irregular bleeding diminishes with time.  Signed copy of informed consent in chart.   Time out was performed.  A medium plastic speculum was placed in the vagina.  The cervix was visualized, prepped using Hibclens, and grasped with a single tooth tenaculum. The uterus was sounded to 7 cm.  Skyla IUD placed per manufacturer's recommendations.   The strings were trimmed to 3 cm.  The patient was given post procedure instructions, including signs and symptoms of infection and to check for the strings after each menses or each month, and refraining from intercourse or anything in the vagina for 3 days.  She was given a 12/15/2019 care card with date Skyla placed, and date Skyla to be removed.  Reviewed red flag symptoms and when to call.   RTC x 4 weeks for IUD string check.    [place], CNM [facility], [facility] 10/23/19 2:33 PM  NDC: 12/23/19 Lot: [id number] Exp: 12/2020

## 2019-10-23 NOTE — Addendum Note (Signed)
Addended by: Shaune Spittle on: 10/23/2019 03:02 PM   Modules accepted: Orders

## 2019-10-23 NOTE — Patient Instructions (Signed)
IUD PLACEMENT POST-PROCEDURE INSTRUCTIONS  1. You may take Ibuprofen, Aleve or Tylenol for pain if needed.  Cramping should resolve within in 24 hours.  2. You may have a small amount of spotting.  You should wear a mini pad for the next few days.  3. You may have intercourse after 72 hours.  If you using this for birth control, it is effective immediately.  4. You need to call if you have any pelvic pain, fever, heavy bleeding or foul smelling vaginal discharge.  Irregular bleeding is common the first several months after having an IUD placed. You do not need to call for this reason unless you are concerned.  5. Shower or bathe as normal  6. You should have a follow-up appointment in 4-8 weeks for a re-check to make sure you are not having any problems.   Levonorgestrel intrauterine device (IUD) What is this medicine? LEVONORGESTREL IUD (LEE voe nor jes trel) is a contraceptive (birth control) device. The device is placed inside the uterus by a healthcare professional. It is used to prevent pregnancy. This device can also be used to treat heavy bleeding that occurs during your period. This medicine may be used for other purposes; ask your health care provider or pharmacist if you have questions. COMMON BRAND NAME(S): Kyleena, LILETTA, Mirena, Skyla What should I tell my health care provider before I take this medicine? They need to know if you have any of these conditions:  abnormal Pap smear  cancer of the breast, uterus, or cervix  diabetes  endometritis  genital or pelvic infection now or in the past  have more than one sexual partner or your partner has more than one partner  heart disease  history of an ectopic or tubal pregnancy  immune system problems  IUD in place  liver disease or tumor  problems with blood clots or take blood-thinners  seizures  use intravenous drugs  uterus of unusual shape  vaginal bleeding that has not been explained  an unusual or  allergic reaction to levonorgestrel, other hormones, silicone, or polyethylene, medicines, foods, dyes, or preservatives  pregnant or trying to get pregnant  breast-feeding How should I use this medicine? This device is placed inside the uterus by a health care professional. Talk to your pediatrician regarding the use of this medicine in children. Special care may be needed. Overdosage: If you think you have taken too much of this medicine contact a poison control center or emergency room at once. NOTE: This medicine is only for you. Do not share this medicine with others. What if I miss a dose? This does not apply. Depending on the brand of device you have inserted, the device will need to be replaced every 3 to 6 years if you wish to continue using this type of birth control. What may interact with this medicine? Do not take this medicine with any of the following medications:  amprenavir  bosentan  fosamprenavir This medicine may also interact with the following medications:  aprepitant  armodafinil  barbiturate medicines for inducing sleep or treating seizures  bexarotene  boceprevir  griseofulvin  medicines to treat seizures like carbamazepine, ethotoin, felbamate, oxcarbazepine, phenytoin, topiramate  modafinil  pioglitazone  rifabutin  rifampin  rifapentine  some medicines to treat HIV infection like atazanavir, efavirenz, indinavir, lopinavir, nelfinavir, tipranavir, ritonavir  St. John's wort  warfarin This list may not describe all possible interactions. Give your health care provider a list of all the medicines, herbs, non-prescription drugs, or   dietary supplements you use. Also tell them if you smoke, drink alcohol, or use illegal drugs. Some items may interact with your medicine. What should I watch for while using this medicine? Visit your doctor or health care professional for regular check ups. See your doctor if you or your partner has sexual  contact with others, becomes HIV positive, or gets a sexual transmitted disease. This product does not protect you against HIV infection (AIDS) or other sexually transmitted diseases. You can check the placement of the IUD yourself by reaching up to the top of your vagina with clean fingers to feel the threads. Do not pull on the threads. It is a good habit to check placement after each menstrual period. Call your doctor right away if you feel more of the IUD than just the threads or if you cannot feel the threads at all. The IUD may come out by itself. You may become pregnant if the device comes out. If you notice that the IUD has come out use a backup birth control method like condoms and call your health care provider. Using tampons will not change the position of the IUD and are okay to use during your period. This IUD can be safely scanned with magnetic resonance imaging (MRI) only under specific conditions. Before you have an MRI, tell your healthcare provider that you have an IUD in place, and which type of IUD you have in place. What side effects may I notice from receiving this medicine? Side effects that you should report to your doctor or health care professional as soon as possible:  allergic reactions like skin rash, itching or hives, swelling of the face, lips, or tongue  fever, flu-like symptoms  genital sores  high blood pressure  no menstrual period for 6 weeks during use  pain, swelling, warmth in the leg  pelvic pain or tenderness  severe or sudden headache  signs of pregnancy  stomach cramping  sudden shortness of breath  trouble with balance, talking, or walking  unusual vaginal bleeding, discharge  yellowing of the eyes or skin Side effects that usually do not require medical attention (report to your doctor or health care professional if they continue or are bothersome):  acne  breast pain  change in sex drive or performance  changes in  weight  cramping, dizziness, or faintness while the device is being inserted  headache  irregular menstrual bleeding within first 3 to 6 months of use  nausea This list may not describe all possible side effects. Call your doctor for medical advice about side effects. You may report side effects to FDA at 1-800-FDA-1088. Where should I keep my medicine? This does not apply. NOTE: This sheet is a summary. It may not cover all possible information. If you have questions about this medicine, talk to your doctor, pharmacist, or health care provider.  2020 Elsevier/Gold Standard (2018-05-13 13:22:01)  

## 2019-10-24 ENCOUNTER — Ambulatory Visit: Payer: Medicaid Other

## 2019-10-28 ENCOUNTER — Telehealth: Payer: Medicaid Other

## 2019-10-28 ENCOUNTER — Ambulatory Visit (INDEPENDENT_AMBULATORY_CARE_PROVIDER_SITE_OTHER)
Admission: RE | Admit: 2019-10-28 | Discharge: 2019-10-28 | Disposition: A | Discharge status: Home / Self Care | Payer: Medicaid Other | Source: Ambulatory Visit

## 2019-10-28 DIAGNOSIS — L0100 Impetigo, unspecified: Secondary | ICD-10-CM

## 2019-10-28 DIAGNOSIS — J0100 Acute maxillary sinusitis, unspecified: Secondary | ICD-10-CM | POA: Diagnosis not present

## 2019-10-28 MED ORDER — DOXYCYCLINE HYCLATE 100 MG PO CAPS: 100.0000 mg | ORAL_CAPSULE | Freq: Two times a day (BID) | ORAL | 0 refills | Status: DC

## 2019-10-28 NOTE — ED Provider Notes (Signed)
Virtual Visit via Video Note:  Yvonne Black  initiated request for Telemedicine visit with [facility] Urgent Care team. I connected with Yvonne Black  on 10/28/2019 at 10:20 AM  for a synchronized telemedicine visit using a video enabled HIPPA compliant telemedicine application. I verified that I am speaking with Yvonne Black  using two identifiers. Yvonne Bail, NP  was physically located in a [facility] Urgent care site and Yvonne Black was located at a different location.   The limitations of evaluation and management by telemedicine as well as the availability of in-person appointments were discussed. Patient was informed that she  may incur a bill ( including co-pay) for this virtual visit encounter. Yvonne Black  expressed understanding and gave verbal consent to proceed with virtual visit.     History of Present Illness:Yvonne Black  is a 34 y.o. female presents for evaluation of sore throat, swollen lymph node on right side of her neck, sneezing, post nasal drip, sinus congestion, cough productive of yellow phlegm x 1 week.  She also has yellow crusted sores around her left nose x 1 day.  She had a temperature of 99.9 on 10/23/2019; no fever since.  She denies shortness of breath, vomiting, diarrhea, or other symptoms.  Treating symptoms at home with Sudafed.     Allergies  Allergen Reactions  . Iodine Anaphylaxis  . Latex Hives  . Penicillins      Past Medical History:  Diagnosis Date  . Hormone disorder      Social History   Tobacco Use  . Smoking status: Black Smoker  . Smokeless tobacco: Black Used  Substance Use Topics  . Alcohol use: No  . Drug use: No    ROS: as stated in HPI.  All other systems reviewed and negative.      Observations/Objective: Physical Exam  VITALS: Patient denies fever. GENERAL: Alert, appears well and in no acute distress. HEENT: Atraumatic.  Crusted sores around left nare.  NECK: Normal movements of the head and  neck. CARDIOPULMONARY: No increased WOB. Speaking in clear sentences. I:E ratio WNL.  MS: Moves all visible extremities without noticeable abnormality. PSYCH: Pleasant and cooperative, well-groomed. Speech normal rate and rhythm. Affect is appropriate. Insight and judgement are appropriate. Attention is focused, linear, and appropriate.  NEURO: CN grossly intact. Oriented as arrived to appointment on time with no prompting. Moves both UE equally.  SKIN: No obvious lesions, wounds, erythema, or cyanosis noted on face or hands.   Assessment and Plan:    ICD-10-CM   1. Acute non-recurrent maxillary sinusitis  J01.00   2. Impetigo  L01.00        Follow Up Instructions: Treating with doxycyline.  Instructed patient to apply OTC antibiotic ointment to her nose sores.  Instructed her to follow-up with her PCP or come here to be seen in person if her symptoms are not improving.  Patient agrees to plan of care.     I discussed the assessment and treatment plan with the patient. The patient was provided an opportunity to ask questions and all were answered. The patient agreed with the plan and demonstrated an understanding of the instructions.   The patient was advised to call back or seek an in-person evaluation if the symptoms worsen or if the condition fails to improve as anticipated.      Yvonne Bail, NP  10/28/2019 10:20 AM         Yvonne Bail, NP 10/28/19 1021

## 2019-10-28 NOTE — Discharge Instructions (Addendum)
Take the doxycycline as directed.  Apply over-the-counter antibiotic ointment to the sores on your nose.    Follow up with your primary care provider or come here to be seen in person if your symptoms are not improving.

## 2019-11-11 ENCOUNTER — Ambulatory Visit: Payer: Medicaid Other | Attending: Internal Medicine

## 2019-11-11 DIAGNOSIS — Z20822 Contact with and (suspected) exposure to covid-19: Secondary | ICD-10-CM

## 2019-11-12 LAB — NOVEL CORONAVIRUS, NAA: SARS-CoV-2, NAA: NOT DETECTED

## 2019-11-12 LAB — SARS-COV-2, NAA 2 DAY TAT

## 2019-11-30 ENCOUNTER — Encounter: Payer: Self-pay | Admitting: Certified Nurse Midwife

## 2019-11-30 ENCOUNTER — Ambulatory Visit (INDEPENDENT_AMBULATORY_CARE_PROVIDER_SITE_OTHER): Payer: Medicaid Other | Admitting: Certified Nurse Midwife

## 2019-11-30 ENCOUNTER — Other Ambulatory Visit: Payer: Self-pay

## 2019-11-30 VITALS — BP 104/70 | HR 94 | Ht 65.0 in | Wt 196.6 lb

## 2019-11-30 DIAGNOSIS — Z0130 Encounter for examination of blood pressure without abnormal findings: Secondary | ICD-10-CM | POA: Diagnosis not present

## 2019-11-30 DIAGNOSIS — Z30431 Encounter for routine checking of intrauterine contraceptive device: Secondary | ICD-10-CM | POA: Diagnosis not present

## 2019-11-30 DIAGNOSIS — R634 Abnormal weight loss: Secondary | ICD-10-CM | POA: Diagnosis not present

## 2019-11-30 DIAGNOSIS — Z6832 Body mass index (BMI) 32.0-32.9, adult: Secondary | ICD-10-CM | POA: Diagnosis not present

## 2019-11-30 DIAGNOSIS — Z975 Presence of (intrauterine) contraceptive device: Secondary | ICD-10-CM | POA: Diagnosis not present

## 2019-11-30 DIAGNOSIS — R232 Flushing: Secondary | ICD-10-CM

## 2019-11-30 MED ORDER — PHENTERMINE HCL 37.5 MG PO CAPS: 37.5000 mg | ORAL_CAPSULE | ORAL | 0 refills | Status: DC

## 2019-11-30 NOTE — Progress Notes (Signed)
GYN ENCOUNTER NOTE  Subjective:       Yvonne Black is a 34 y.o. G61P1012 female is here for gynecologic evaluation of the following issues:  1. IUD check 2. Weight management    Doing well. Reports vaginal spotting that is pink since insertion uses panty liner during the day.   Requests refill of phentermine. Continues to work out. Enjoys doing cardio.  Denies difficulty breathing or respiratory distress, chest pain, abdominal pain, excessive vaginal bleeding, dysuria, leg pain or swelling   Gynecologic History  No LMP recorded (lmp unknown). (Menstrual status: IUD).   Contraception:Skyla IUD   Last Pap: 01/22/2019. Results were: Neg/Neg.  Obstetric History  OB History  Gravida Para Term Preterm AB Living  2 1 1   1 2   SAB TAB Ectopic Multiple Live Births  1     1 2     # Outcome Date GA Lbr Len/2nd Weight Sex Delivery Anes PTL Lv  2A Term 05/03/10   4 lb 6 oz (1.984 kg) M CS-LTranv   LIV  2B Term 05/03/10   4 lb 4 oz (1.928 kg) M CS-LTranv Spinal N LIV  1 SAB 2008            Past Medical History:  Diagnosis Date  . Hormone disorder     Past Surgical History:  Procedure Laterality Date  . BREAST SURGERY  12/19/2016   Reduction  . CESAREAN SECTION  05/03/2010  . CYST REMOVAL HAND    . tummy tuck  12/19/2016    Current Outpatient Medications on File Prior to Visit  Medication Sig Dispense Refill  . nystatin (MYCOSTATIN/NYSTOP) powder Apply 1 application topically 3 (three) times daily. 15 g 0  . pantoprazole (PROTONIX) 40 MG tablet Take 1 tablet (40 mg total) by mouth daily. 30 tablet 1  . doxycycline (VIBRAMYCIN) 100 MG capsule Take 1 capsule (100 mg total) by mouth 2 (two) times daily. (Patient not taking: Reported on 11/30/2019) 20 capsule 0   No current facility-administered medications on file prior to visit.    Allergies  Allergen Reactions  . Iodine Anaphylaxis  . Latex Hives  . Penicillins     Social History   Socioeconomic History  . Marital  status: Married    Spouse name: Not on file  . Number of children: Not on file  . Years of education: Not on file  . Highest education level: Not on file  Occupational History  . Not on file  Tobacco Use  . Smoking status: Never Smoker  . Smokeless tobacco: Never Used  Substance and Sexual Activity  . Alcohol use: No  . Drug use: No  . Sexual activity: Yes    Partners: Male    Birth control/protection: None, Condom  Other Topics Concern  . Not on file  Social History Narrative  . Not on file   Social Determinants of Health   Financial Resource Strain:   . Difficulty of Paying Living Expenses:   Food Insecurity:   . Worried About 02/18/2017 in the Last Year:   . 12/02/2019 in the Last Year:   Transportation Needs:   . Programme researcher, broadcasting/film/video (Medical):   Barista Lack of Transportation (Non-Medical):   Physical Activity:   . Days of Exercise per Week:   . Minutes of Exercise per Session:   Stress:   . Feeling of Stress :   Social Connections:   . Frequency of Communication with Friends and Family:   .  Frequency of Social Gatherings with Friends and Family:   . Attends Religious Services:   . Active Member of Clubs or Organizations:   . Attends Archivist Meetings:   Marland Kitchen Marital Status:   Intimate Partner Violence:   . Fear of Current or Ex-Partner:   . Emotionally Abused:   Marland Kitchen Physically Abused:   . Sexually Abused:     Family History  Problem Relation Age of Onset  . Breast cancer Other 50  . Diabetes Maternal Grandmother   . Hypertension Maternal Grandmother   . Diabetes Maternal Grandfather   . Hypertension Maternal Grandfather   . Healthy Mother   . Healthy Father   . Ovarian cancer Neg Hx   . Colon cancer Neg Hx     The following portions of the patient's history were reviewed and updated as appropriate: allergies, current medications, past family history, past medical history, past social history, past surgical history and problem  list.  Review of Systems  ROS- negative except as noted above. Information obtained from patient.  Objective:   BP 104/70   Pulse 94   Ht 5\' 5"  (1.651 m)   Wt 196 lb 9.6 oz (89.2 kg)   LMP  (LMP Unknown)   BMI 32.72 kg/m    CONSTITUTIONAL: Well-developed, well-nourished female in no acute distress.   WAIST CIRCUMFERENCE: 34.5 inches  PELVIC:  External Genitalia: Normal  Vagina: Normal  Cervix: Normal, IUD strings present    MUSCULOSKELETAL: Normal range of motion. No tenderness.  No cyanosis, clubbing, or edema.  Assessment:   1. IUD check up   2. IUD (intrauterine device) in place   3. Hot flashes   4. Weight loss due to medication   5. Blood pressure check   6. BMI 32.0-32.9,adult   Plan:   B-12 given in office today. See MAR.  Phentermine Rx'd. See orders.  Encouraged patient to continue with preventative health screenings and provided list of local PCP's.   Provided handout for medication options for hot flashes. Anticipatory guidance given.  Reviewed red flags and when to call the office.  RTC x 4 weeks for weight management or sooner if needed.   Fransico Him RN [place] 11/30/19 4:41 PM

## 2019-11-30 NOTE — Patient Instructions (Addendum)
Perimenopause  Perimenopause is the normal time of life before and after menstrual periods stop completely (menopause). Perimenopause can begin 2-8 years before menopause, and it usually lasts for 1 year after menopause. During perimenopause, the ovaries may or may not produce an egg. What are the causes? This condition is caused by a natural change in hormone levels that happens as you get older. What increases the risk? This condition is more likely to start at an earlier age if you have certain medical conditions or treatments, including:  A tumor of the pituitary gland in the brain.  A disease that affects the ovaries and hormone production.  Radiation treatment for cancer.  Certain cancer treatments, such as chemotherapy or hormone (anti-estrogen) therapy.  Heavy smoking and excessive alcohol use.  Family history of early menopause. What are the signs or symptoms? Perimenopausal changes affect each woman differently. Symptoms of this condition may include:  Hot flashes.  Night sweats.  Irregular menstrual periods.  Decreased sex drive.  Vaginal dryness.  Headaches.  Mood swings.  Depression.  Memory problems or trouble concentrating.  Irritability.  Tiredness.  Weight gain.  Anxiety.  Trouble getting pregnant. How is this diagnosed? This condition is diagnosed based on your medical history, a physical exam, your age, your menstrual history, and your symptoms. Hormone tests may also be done. How is this treated? In some cases, no treatment is needed. You and your health care provider should make a decision together about whether treatment is necessary. Treatment will be based on your individual condition and preferences. Various treatments are available, such as:  Menopausal hormone therapy (MHT).  Medicines to treat specific symptoms.  Acupuncture.  Vitamin or herbal supplements. Before starting treatment, make sure to let your health care provider  know if you have a personal or family history of:  Heart disease.  Breast cancer.  Blood clots.  Diabetes.  Osteoporosis. Follow these instructions at home: Lifestyle  Do not use any products that contain nicotine or tobacco, such as cigarettes and e-cigarettes. If you need help quitting, ask your health care provider.  Eat a balanced diet that includes fresh fruits and vegetables, whole grains, soybeans, eggs, lean meat, and low-fat dairy.  Get at least 30 minutes of physical activity on 5 or more days each week.  Avoid alcoholic and caffeinated beverages, as well as spicy foods. This may help prevent hot flashes.  Get 7-8 hours of sleep each night.  Dress in layers that can be removed to help you manage hot flashes.  Find ways to manage stress, such as deep breathing, meditation, or journaling. General instructions  Keep track of your menstrual periods, including: ? When they occur. ? How heavy they are and how long they last. ? How much time passes between periods.  Keep track of your symptoms, noting when they start, how often you have them, and how long they last.  Take over-the-counter and prescription medicines only as told by your health care provider.  Take vitamin supplements only as told by your health care provider. These may include calcium, vitamin E, and vitamin D.  Use vaginal lubricants or moisturizers to help with vaginal dryness and improve comfort during sex.  Talk with your health care provider before starting any herbal supplements.  Keep all follow-up visits as told by your health care provider. This is important. This includes any group therapy or counseling. Contact a health care provider if:  You have heavy vaginal bleeding or pass blood clots.  Your period   lasts more than 2 days longer than normal.  Your periods are recurring sooner than 21 days.  You bleed after having sex. Get help right away if:  You have chest pain, trouble  breathing, or trouble talking.  You have severe depression.  You have pain when you urinate.  You have severe headaches.  You have vision problems. Summary  Perimenopause is the time when a woman's body begins to move into menopause. This may happen naturally or as a result of other health problems or medical treatments.  Perimenopause can begin 2-8 years before menopause, and it usually lasts for 1 year after menopause.  Perimenopausal symptoms can be managed through medicines, lifestyle changes, and complementary therapies such as acupuncture. This information is not intended to replace advice given to you by your health care provider. Make sure you discuss any questions you have with your health care provider. Document Revised: 06/14/2017 Document Reviewed: 08/07/2016 Elsevier Patient Education  [place].   Phentermine tablets or capsules What is this medicine? PHENTERMINE (FEN ter meen) decreases your appetite. It is used with a reduced calorie diet and exercise to help you lose weight. This medicine may be used for other purposes; ask your health care provider or pharmacist if you have questions. COMMON BRAND NAME(S): Adipex-P, Atti-Plex P, Atti-Plex P Spansule, Fastin, Lomaira, Pro-Fast, Tara-8 What should I tell my health care provider before I take this medicine? They need to know if you have any of these conditions:  agitation or nervousness  diabetes  glaucoma  heart disease  high blood pressure  history of drug abuse or addiction  history of stroke  kidney disease  lung disease called Primary Pulmonary Hypertension (PPH)  taken an MAOI like Carbex, Eldepryl, Marplan, Nardil, or Parnate in last 14 days  taking stimulant medicines for attention disorders, weight loss, or to stay awake  thyroid disease  an unusual or allergic reaction to phentermine, other medicines, foods, dyes, or preservatives  pregnant or trying to get  pregnant  breast-feeding How should I use this medicine? Take this medicine by mouth with a glass of water. Follow the directions on the prescription label. Take your medicine at regular intervals. Do not take it more often than directed. Do not stop taking except on your doctor's advice. Talk to your pediatrician regarding the use of this medicine in children. While this drug may be prescribed for children 17 years or older for selected conditions, precautions do apply. Overdosage: If you think you have taken too much of this medicine contact a poison control center or emergency room at once. NOTE: This medicine is only for you. Do not share this medicine with others. What if I miss a dose? If you miss a dose, take it as soon as you can. If it is almost time for your next dose, take only that dose. Do not take double or extra doses. What may interact with this medicine? Do not take this medicine with any of the following medications:  MAOIs like Carbex, Eldepryl, Marplan, Nardil, and Parnate This medicine may also interact with the following medications:  alcohol  certain medicines for depression, anxiety, or psychotic disorders  certain medicines for high blood pressure  linezolid  medicines for colds or breathing difficulties like pseudoephedrine or phenylephrine  medicines for diabetes  sibutramine  stimulant medicines for attention disorders, weight loss, or to stay awake This list may not describe all possible interactions. Give your health care provider a list of all the medicines, herbs,  non-prescription drugs, or dietary supplements you use. Also tell them if you smoke, drink alcohol, or use illegal drugs. Some items may interact with your medicine. What should I watch for while using this medicine? Visit your doctor or health care provider for regular checks on your progress. Do not stop taking except on your health care provider's advice. You may develop a severe  reaction. Your health care provider will tell you how much medicine to take. Do not take this medicine close to bedtime. It may prevent you from sleeping. You may get drowsy or dizzy. Do not drive, use machinery, or do anything that needs mental alertness until you know how this medicine affects you. Do not stand or sit up quickly, especially if you are an older patient. This reduces the risk of dizzy or fainting spells. Alcohol may increase dizziness and drowsiness. Avoid alcoholic drinks. This medicine may affect blood sugar levels. Ask your healthcare provider if changes in diet or medicines are needed if you have diabetes. Women should inform their health care provider if they wish to become pregnant or think they might be pregnant. Losing weight while pregnant is not advised and may cause harm to the unborn child. Talk to your health care provider for more information. What side effects may I notice from receiving this medicine? Side effects that you should report to your doctor or health care professional as soon as possible:  allergic reactions like skin rash, itching or hives, swelling of the face, lips, or tongue  breathing problems  changes in emotions or moods  changes in vision  chest pain or chest tightness  fast, irregular heartbeat  feeling faint or lightheaded  increased blood pressure  irritable  restlessness  tremors  seizures  signs and symptoms of a stroke like changes in vision; confusion; trouble speaking or understanding; severe headaches; sudden numbness or weakness of the face, arm or leg; trouble walking; dizziness; loss of balance or coordination  unusually weak or tired Side effects that usually do not require medical attention (report to your doctor or health care professional if they continue or are bothersome):  changes in taste  constipation or diarrhea  dizziness  dry mouth  headache  trouble sleeping  upset stomach This list may not  describe all possible side effects. Call your doctor for medical advice about side effects. You may report side effects to FDA at 1-800-FDA-1088. Where should I keep my medicine? Keep out of the reach of children. This medicine can be abused. Keep your medicine in a safe place to protect it from theft. Do not share this medicine with anyone. Selling or giving away this medicine is dangerous and against the law. This medicine may cause harm and death if it is taken by other adults, children, or pets. Return medicine that has not been used to an official disposal site. Contact the DEA at 551-841-5726 or your city/county government to find a site. If you cannot return the medicine, mix any unused medicine with a substance like cat litter or coffee grounds. Then throw the medicine away in a sealed container like a sealed bag or coffee can with a lid. Do not use the medicine after the expiration date. Store at room temperature between 20 and 25 degrees C (68 and 77 degrees F). Keep container tightly closed. NOTE: This sheet is a summary. It may not cover all possible information. If you have questions about this medicine, talk to your doctor, pharmacist, or health care provider.  2020  Elsevier/Gold Standard (2019-05-08 12:54:20)   Levonorgestrel intrauterine device (IUD) What is this medicine? LEVONORGESTREL IUD (LEE voe nor jes trel) is a contraceptive (birth control) device. The device is placed inside the uterus by a healthcare professional. It is used to prevent pregnancy. This device can also be used to treat heavy bleeding that occurs during your period. This medicine may be used for other purposes; ask your health care provider or pharmacist if you have questions. COMMON BRAND NAME(S): Cameron Ali What should I tell my health care provider before I take this medicine? They need to know if you have any of these conditions:  abnormal Pap smear  cancer of the breast, uterus,  or cervix  diabetes  endometritis  genital or pelvic infection now or in the past  have more than one sexual partner or your partner has more than one partner  heart disease  history of an ectopic or tubal pregnancy  immune system problems  IUD in place  liver disease or tumor  problems with blood clots or take blood-thinners  seizures  use intravenous drugs  uterus of unusual shape  vaginal bleeding that has not been explained  an unusual or allergic reaction to levonorgestrel, other hormones, silicone, or polyethylene, medicines, foods, dyes, or preservatives  pregnant or trying to get pregnant  breast-feeding How should I use this medicine? This device is placed inside the uterus by a health care professional. Talk to your pediatrician regarding the use of this medicine in children. Special care may be needed. Overdosage: If you think you have taken too much of this medicine contact a poison control center or emergency room at once. NOTE: This medicine is only for you. Do not share this medicine with others. What if I miss a dose? This does not apply. Depending on the brand of device you have inserted, the device will need to be replaced every 3 to 6 years if you wish to continue using this type of birth control. What may interact with this medicine? Do not take this medicine with any of the following medications:  amprenavir  bosentan  fosamprenavir This medicine may also interact with the following medications:  aprepitant  armodafinil  barbiturate medicines for inducing sleep or treating seizures  bexarotene  boceprevir  griseofulvin  medicines to treat seizures like carbamazepine, ethotoin, felbamate, oxcarbazepine, phenytoin, topiramate  modafinil  pioglitazone  rifabutin  rifampin  rifapentine  some medicines to treat HIV infection like atazanavir, efavirenz, indinavir, lopinavir, nelfinavir, tipranavir, ritonavir  [facility]  wort  warfarin This list may not describe all possible interactions. Give your health care provider a list of all the medicines, herbs, non-prescription drugs, or dietary supplements you use. Also tell them if you smoke, drink alcohol, or use illegal drugs. Some items may interact with your medicine. What should I watch for while using this medicine? Visit your doctor or health care professional for regular check ups. See your doctor if you or your partner has sexual contact with others, becomes HIV positive, or gets a sexual transmitted disease. This product does not protect you against HIV infection (AIDS) or other sexually transmitted diseases. You can check the placement of the IUD yourself by reaching up to the top of your vagina with clean fingers to feel the threads. Do not pull on the threads. It is a good habit to check placement after each menstrual period. Call your doctor right away if you feel more of the IUD than just the threads or  if you cannot feel the threads at all. The IUD may come out by itself. You may become pregnant if the device comes out. If you notice that the IUD has come out use a backup birth control method like condoms and call your health care provider. Using tampons will not change the position of the IUD and are okay to use during your period. This IUD can be safely scanned with magnetic resonance imaging (MRI) only under specific conditions. Before you have an MRI, tell your healthcare provider that you have an IUD in place, and which type of IUD you have in place. What side effects may I notice from receiving this medicine? Side effects that you should report to your doctor or health care professional as soon as possible:  allergic reactions like skin rash, itching or hives, swelling of the face, lips, or tongue  fever, flu-like symptoms  genital sores  high blood pressure  no menstrual period for 6 weeks during use  pain, swelling, warmth in the  leg  pelvic pain or tenderness  severe or sudden headache  signs of pregnancy  stomach cramping  sudden shortness of breath  trouble with balance, talking, or walking  unusual vaginal bleeding, discharge  yellowing of the eyes or skin Side effects that usually do not require medical attention (report to your doctor or health care professional if they continue or are bothersome):  acne  breast pain  change in sex drive or performance  changes in weight  cramping, dizziness, or faintness while the device is being inserted  headache  irregular menstrual bleeding within first 3 to 6 months of use  nausea This list may not describe all possible side effects. Call your doctor for medical advice about side effects. You may report side effects to FDA at 1-800-FDA-1088. Where should I keep my medicine? This does not apply. NOTE: This sheet is a summary. It may not cover all possible information. If you have questions about this medicine, talk to your doctor, pharmacist, or health care provider.  2020 Elsevier/Gold Standard (2018-05-13 13:22:01)

## 2019-11-30 NOTE — Progress Notes (Signed)
I have seen, interviewed, and examined the patient in conjunction with the [employer] Nurse Practitioner student and affirm the diagnosis and management plan.   Gunnar Bulla, CNM [facility], [facility] 11/30/19 4:50 PM

## 2019-12-07 ENCOUNTER — Encounter: Payer: Medicaid Other | Admitting: Certified Nurse Midwife

## 2019-12-24 ENCOUNTER — Other Ambulatory Visit: Payer: Self-pay

## 2019-12-24 ENCOUNTER — Encounter: Payer: Self-pay | Admitting: Certified Nurse Midwife

## 2019-12-24 ENCOUNTER — Ambulatory Visit: Payer: Medicaid Other

## 2019-12-24 ENCOUNTER — Ambulatory Visit (INDEPENDENT_AMBULATORY_CARE_PROVIDER_SITE_OTHER): Payer: Medicaid Other | Admitting: Certified Nurse Midwife

## 2019-12-24 VITALS — BP 109/79 | HR 84 | Ht 65.0 in | Wt 192.1 lb

## 2019-12-24 DIAGNOSIS — Z6834 Body mass index (BMI) 34.0-34.9, adult: Secondary | ICD-10-CM | POA: Diagnosis not present

## 2019-12-24 DIAGNOSIS — Z0130 Encounter for examination of blood pressure without abnormal findings: Secondary | ICD-10-CM | POA: Diagnosis not present

## 2019-12-24 DIAGNOSIS — R634 Abnormal weight loss: Secondary | ICD-10-CM

## 2019-12-24 MED ORDER — PHENTERMINE HCL 37.5 MG PO CAPS: 37.5000 mg | ORAL_CAPSULE | ORAL | 0 refills | Status: DC

## 2019-12-24 MED ORDER — CYANOCOBALAMIN 1000 MCG/ML IJ SOLN: 1000.0000 ug | Freq: Once | INTRAMUSCULAR | 0 refills | Status: AC

## 2019-12-24 MED ORDER — CYANOCOBALAMIN 1000 MCG/ML IJ SOLN
1000.0000 ug | Freq: Once | INTRAMUSCULAR | Status: AC
  Administered 2019-12-24: 1000 ug via INTRAMUSCULAR

## 2019-12-24 NOTE — Progress Notes (Signed)
GYN ENCOUNTER NOTE  Subjective:       Yvonne Black is a 34 y.o. G25P1012 female here for weight management visit.   Admits to "not doing everything she could have done this month for her weight loss"; notes increased work time in front of computer and less activity.   No negative medication side effects.   Denies difficulty breathing or respiratory distress, chest pain, abdominal pain, excessive vaginal bleeding, dysuria, and leg pain or swelling.    Gynecologic History  No LMP recorded (lmp unknown). (Menstrual status: IUD).  Contraception: IUD, Christean Grief  Last Pap: 01/2019. Results were: Neg/Neg  Obstetric History  OB History  Gravida Para Term Preterm AB Living  2 1 1   1 2   SAB TAB Ectopic Multiple Live Births  1     1 2     # Outcome Date GA Lbr Len/2nd Weight Sex Delivery Anes PTL Lv  2A Term 05/03/10   4 lb 6 oz (1.984 kg) M CS-LTranv   LIV  2B Term 05/03/10   4 lb 4 oz (1.928 kg) M CS-LTranv Spinal N LIV  1 SAB 2008            Past Medical History:  Diagnosis Date  . Hormone disorder     Past Surgical History:  Procedure Laterality Date  . BREAST SURGERY  12/19/2016   Reduction  . CESAREAN SECTION  05/03/2010  . CYST REMOVAL HAND    . tummy tuck  12/19/2016    Current Outpatient Medications on File Prior to Visit  Medication Sig Dispense Refill  . pantoprazole (PROTONIX) 40 MG tablet Take 1 tablet (40 mg total) by mouth daily. 30 tablet 1  . doxycycline (VIBRAMYCIN) 100 MG capsule Take 1 capsule (100 mg total) by mouth 2 (two) times daily. (Patient not taking: Reported on 11/30/2019) 20 capsule 0  . nystatin (MYCOSTATIN/NYSTOP) powder Apply 1 application topically 3 (three) times daily. (Patient not taking: Reported on 12/24/2019) 15 g 0   No current facility-administered medications on file prior to visit.    Allergies  Allergen Reactions  . Iodine Anaphylaxis  . Latex Hives  . Penicillins     Social History   Socioeconomic History  . Marital  status: Married    Spouse name: Not on file  . Number of children: Not on file  . Years of education: Not on file  . Highest education level: Not on file  Occupational History  . Not on file  Tobacco Use  . Smoking status: Never Smoker  . Smokeless tobacco: Never Used  Vaping Use  . Vaping Use: Never used  Substance and Sexual Activity  . Alcohol use: No  . Drug use: No  . Sexual activity: Yes    Partners: Male    Birth control/protection: None, Condom  Other Topics Concern  . Not on file  Social History Narrative  . Not on file   Social Determinants of Health   Financial Resource Strain:   . Difficulty of Paying Living Expenses:   Food Insecurity:   . Worried About 12/02/2019 in the Last Year:   . 02/23/2020 in the Last Year:   Transportation Needs:   . Programme researcher, broadcasting/film/video (Medical):   Barista Lack of Transportation (Non-Medical):   Physical Activity:   . Days of Exercise per Week:   . Minutes of Exercise per Session:   Stress:   . Feeling of Stress :   Social Connections:   .  Frequency of Communication with Friends and Family:   . Frequency of Social Gatherings with Friends and Family:   . Attends Religious Services:   . Active Member of Clubs or Organizations:   . Attends Archivist Meetings:   Marland Kitchen Marital Status:   Intimate Partner Violence:   . Fear of Current or Ex-Partner:   . Emotionally Abused:   Marland Kitchen Physically Abused:   . Sexually Abused:     Family History  Problem Relation Age of Onset  . Breast cancer Other 50  . Diabetes Maternal Grandmother   . Hypertension Maternal Grandmother   . Diabetes Maternal Grandfather   . Hypertension Maternal Grandfather   . Healthy Mother   . Healthy Father   . Ovarian cancer Neg Hx   . Colon cancer Neg Hx     The following portions of the patient's history were reviewed and updated as appropriate: allergies, current medications, past family history, past medical history, past social history,  past surgical history and problem list.  Review of Systems  ROS negative except as noted above. Information obtained from patient.   Objective:   BP 109/79   Pulse 84   Ht 5\' 5"  (1.651 m)   Wt 192 lb 1 oz (87.1 kg)   LMP  (LMP Unknown)   BMI 31.96 kg/m    CONSTITUTIONAL: Well-developed, well-nourished female in no acute distress.   WAIST CIRCUMFERENCE: 37 inches   Assessment:   1. BMI 34.0-34.9,adult   2. Weight loss due to medication   3. Blood pressure check   Plan:   Rx Phentermine and B-12, see orders.   Encouraged seated exercise while working, options discussed.   Reviewed red flag symptoms and when to call.   RTC x 1 month for weight management visit or sooner if needed.    Diona Fanti, CNM [facility], [facility] 12/24/19 5:06 PM

## 2019-12-24 NOTE — Patient Instructions (Signed)
Calorie Counting for Weight Loss Calories are units of energy. Your body needs a certain amount of calories from food to keep you going throughout the day. When you eat more calories than your body needs, your body stores the extra calories as fat. When you eat fewer calories than your body needs, your body burns fat to get the energy it needs. Calorie counting means keeping track of how many calories you eat and drink each day. Calorie counting can be helpful if you need to lose weight. If you make sure to eat fewer calories than your body needs, you should lose weight. Ask your health care provider what a healthy weight is for you. For calorie counting to work, you will need to eat the right number of calories in a day in order to lose a healthy amount of weight per week. A dietitian can help you determine how many calories you need in a day and will give you suggestions on how to reach your calorie goal.  A healthy amount of weight to lose per week is usually 1-2 lb (0.5-0.9 kg). This usually means that your daily calorie intake should be reduced by 500-750 calories.  Eating 1,200 - 1,500 calories per day can help most women lose weight.  Eating 1,500 - 1,800 calories per day can help most men lose weight. What is my plan? My goal is to have __________ calories per day. If I have this many calories per day, I should lose around __________ pounds per week. What do I need to know about calorie counting? In order to meet your daily calorie goal, you will need to:  Find out how many calories are in each food you would like to eat. Try to do this before you eat.  Decide how much of the food you plan to eat.  Write down what you ate and how many calories it had. Doing this is called keeping a food log. To successfully lose weight, it is important to balance calorie counting with a healthy lifestyle that includes regular activity. Aim for 150 minutes of moderate exercise (such as walking) or 75  minutes of vigorous exercise (such as running) each week. Where do I find calorie information?  The number of calories in a food can be found on a Nutrition Facts label. If a food does not have a Nutrition Facts label, try to look up the calories online or ask your dietitian for help. Remember that calories are listed per serving. If you choose to have more than one serving of a food, you will have to multiply the calories per serving by the amount of servings you plan to eat. For example, the label on a package of bread might say that a serving size is 1 slice and that there are 90 calories in a serving. If you eat 1 slice, you will have eaten 90 calories. If you eat 2 slices, you will have eaten 180 calories. How do I keep a food log? Immediately after each meal, record the following information in your food log:  What you ate. Don't forget to include toppings, sauces, and other extras on the food.  How much you ate. This can be measured in cups, ounces, or number of items.  How many calories each food and drink had.  The total number of calories in the meal. Keep your food log near you, such as in a small notebook in your pocket, or use a mobile app or website. Some programs will calculate   calories for you and show you how many calories you have left for the day to meet your goal. What are some calorie counting tips?   Use your calories on foods and drinks that will fill you up and not leave you hungry: ? Some examples of foods that fill you up are nuts and nut butters, vegetables, lean proteins, and high-fiber foods like whole grains. High-fiber foods are foods with more than 5 g fiber per serving. ? Drinks such as sodas, specialty coffee drinks, alcohol, and juices have a lot of calories, yet do not fill you up.  Eat nutritious foods and avoid empty calories. Empty calories are calories you get from foods or beverages that do not have many vitamins or protein, such as candy, sweets, and  soda. It is better to have a nutritious high-calorie food (such as an avocado) than a food with few nutrients (such as a bag of chips).  Know how many calories are in the foods you eat most often. This will help you calculate calorie counts faster.  Pay attention to calories in drinks. Low-calorie drinks include water and unsweetened drinks.  Pay attention to nutrition labels for "low fat" or "fat free" foods. These foods sometimes have the same amount of calories or more calories than the full fat versions. They also often have added sugar, starch, or salt, to make up for flavor that was removed with the fat.  Find a way of tracking calories that works for you. Get creative. Try different apps or programs if writing down calories does not work for you. What are some portion control tips?  Know how many calories are in a serving. This will help you know how many servings of a certain food you can have.  Use a measuring cup to measure serving sizes. You could also try weighing out portions on a kitchen scale. With time, you will be able to estimate serving sizes for some foods.  Take some time to put servings of different foods on your favorite plates, bowls, and cups so you know what a serving looks like.  Try not to eat straight from a bag or box. Doing this can lead to overeating. Put the amount you would like to eat in a cup or on a plate to make sure you are eating the right portion.  Use smaller plates, glasses, and bowls to prevent overeating.  Try not to multitask (for example, watch TV or use your computer) while eating. If it is time to eat, sit down at a table and enjoy your food. This will help you to know when you are full. It will also help you to be aware of what you are eating and how much you are eating. What are tips for following this plan? Reading food labels  Check the calorie count compared to the serving size. The serving size may be smaller than what you are used to  eating.  Check the source of the calories. Make sure the food you are eating is high in vitamins and protein and low in saturated and trans fats. Shopping  Read nutrition labels while you shop. This will help you make healthy decisions before you decide to purchase your food.  Make a grocery list and stick to it. Cooking  Try to cook your favorite foods in a healthier way. For example, try baking instead of frying.  Use low-fat dairy products. Meal planning  Use more fruits and vegetables. Half of your plate should be fruits   and vegetables.  Include lean proteins like poultry and fish. How do I count calories when eating out?  Ask for smaller portion sizes.  Consider sharing an entree and sides instead of getting your own entree.  If you get your own entree, eat only half. Ask for a box at the beginning of your meal and put the rest of your entree in it so you are not tempted to eat it.  If calories are listed on the menu, choose the lower calorie options.  Choose dishes that include vegetables, fruits, whole grains, low-fat dairy products, and lean protein.  Choose items that are boiled, broiled, grilled, or steamed. Stay away from items that are buttered, battered, fried, or served with cream sauce. Items labeled "crispy" are usually fried, unless stated otherwise.  Choose water, low-fat milk, unsweetened iced tea, or other drinks without added sugar. If you want an alcoholic beverage, choose a lower calorie option such as a glass of wine or light beer.  Ask for dressings, sauces, and syrups on the side. These are usually high in calories, so you should limit the amount you eat.  If you want a salad, choose a garden salad and ask for grilled meats. Avoid extra toppings like bacon, cheese, or fried items. Ask for the dressing on the side, or ask for olive oil and vinegar or lemon to use as dressing.  Estimate how many servings of a food you are given. For example, a serving of  cooked rice is  cup or about the size of half a baseball. Knowing serving sizes will help you be aware of how much food you are eating at restaurants. The list below tells you how big or small some common portion sizes are based on everyday objects: ? 1 oz--4 stacked dice. ? 3 oz--1 deck of cards. ? 1 tsp--1 die. ? 1 Tbsp-- a ping-pong ball. ? 2 Tbsp--1 ping-pong ball. ?  cup-- baseball. ? 1 cup--1 baseball. Summary  Calorie counting means keeping track of how many calories you eat and drink each day. If you eat fewer calories than your body needs, you should lose weight.  A healthy amount of weight to lose per week is usually 1-2 lb (0.5-0.9 kg). This usually means reducing your daily calorie intake by 500-750 calories.  The number of calories in a food can be found on a Nutrition Facts label. If a food does not have a Nutrition Facts label, try to look up the calories online or ask your dietitian for help.  Use your calories on foods and drinks that will fill you up, and not on foods and drinks that will leave you hungry.  Use smaller plates, glasses, and bowls to prevent overeating. This information is not intended to replace advice given to you by your health care provider. Make sure you discuss any questions you have with your health care provider. Document Revised: 03/21/2018 Document Reviewed: 06/01/2016 Elsevier Patient Education  2020 Elsevier Inc.   Phentermine tablets or capsules What is this medicine? PHENTERMINE (FEN ter meen) decreases your appetite. It is used with a reduced calorie diet and exercise to help you lose weight. This medicine may be used for other purposes; ask your health care provider or pharmacist if you have questions. COMMON BRAND NAME(S): Adipex-P, Atti-Plex P, Atti-Plex P Spansule, Fastin, Lomaira, Pro-Fast, Tara-8 What should I tell my health care provider before I take this medicine? They need to know if you have any of these  conditions:  agitation or   nervousness  diabetes  glaucoma  heart disease  high blood pressure  history of drug abuse or addiction  history of stroke  kidney disease  lung disease called Primary Pulmonary Hypertension (PPH)  taken an MAOI like Carbex, Eldepryl, Marplan, Nardil, or Parnate in last 14 days  taking stimulant medicines for attention disorders, weight loss, or to stay awake  thyroid disease  an unusual or allergic reaction to phentermine, other medicines, foods, dyes, or preservatives  pregnant or trying to get pregnant  breast-feeding How should I use this medicine? Take this medicine by mouth with a glass of water. Follow the directions on the prescription label. Take your medicine at regular intervals. Do not take it more often than directed. Do not stop taking except on your doctor's advice. Talk to your pediatrician regarding the use of this medicine in children. While this drug may be prescribed for children 17 years or older for selected conditions, precautions do apply. Overdosage: If you think you have taken too much of this medicine contact a poison control center or emergency room at once. NOTE: This medicine is only for you. Do not share this medicine with others. What if I miss a dose? If you miss a dose, take it as soon as you can. If it is almost time for your next dose, take only that dose. Do not take double or extra doses. What may interact with this medicine? Do not take this medicine with any of the following medications:  MAOIs like Carbex, Eldepryl, Marplan, Nardil, and Parnate This medicine may also interact with the following medications:  alcohol  certain medicines for depression, anxiety, or psychotic disorders  certain medicines for high blood pressure  linezolid  medicines for colds or breathing difficulties like pseudoephedrine or phenylephrine  medicines for diabetes  sibutramine  stimulant medicines for attention  disorders, weight loss, or to stay awake This list may not describe all possible interactions. Give your health care provider a list of all the medicines, herbs, non-prescription drugs, or dietary supplements you use. Also tell them if you smoke, drink alcohol, or use illegal drugs. Some items may interact with your medicine. What should I watch for while using this medicine? Visit your doctor or health care provider for regular checks on your progress. Do not stop taking except on your health care provider's advice. You may develop a severe reaction. Your health care provider will tell you how much medicine to take. Do not take this medicine close to bedtime. It may prevent you from sleeping. You may get drowsy or dizzy. Do not drive, use machinery, or do anything that needs mental alertness until you know how this medicine affects you. Do not stand or sit up quickly, especially if you are an older patient. This reduces the risk of dizzy or fainting spells. Alcohol may increase dizziness and drowsiness. Avoid alcoholic drinks. This medicine may affect blood sugar levels. Ask your healthcare provider if changes in diet or medicines are needed if you have diabetes. Women should inform their health care provider if they wish to become pregnant or think they might be pregnant. Losing weight while pregnant is not advised and may cause harm to the unborn child. Talk to your health care provider for more information. What side effects may I notice from receiving this medicine? Side effects that you should report to your doctor or health care professional as soon as possible:  allergic reactions like skin rash, itching or hives, swelling of the face, lips, or   tongue  breathing problems  changes in emotions or moods  changes in vision  chest pain or chest tightness  fast, irregular heartbeat  feeling faint or lightheaded  increased blood  pressure  irritable  restlessness  tremors  seizures  signs and symptoms of a stroke like changes in vision; confusion; trouble speaking or understanding; severe headaches; sudden numbness or weakness of the face, arm or leg; trouble walking; dizziness; loss of balance or coordination  unusually weak or tired Side effects that usually do not require medical attention (report to your doctor or health care professional if they continue or are bothersome):  changes in taste  constipation or diarrhea  dizziness  dry mouth  headache  trouble sleeping  upset stomach This list may not describe all possible side effects. Call your doctor for medical advice about side effects. You may report side effects to FDA at 1-800-FDA-1088. Where should I keep my medicine? Keep out of the reach of children. This medicine can be abused. Keep your medicine in a safe place to protect it from theft. Do not share this medicine with anyone. Selling or giving away this medicine is dangerous and against the law. This medicine may cause harm and death if it is taken by other adults, children, or pets. Return medicine that has not been used to an official disposal site. Contact the DEA at 1-800-882-9539 or your city/county government to find a site. If you cannot return the medicine, mix any unused medicine with a substance like cat litter or coffee grounds. Then throw the medicine away in a sealed container like a sealed bag or coffee can with a lid. Do not use the medicine after the expiration date. Store at room temperature between 20 and 25 degrees C (68 and 77 degrees F). Keep container tightly closed. NOTE: This sheet is a summary. It may not cover all possible information. If you have questions about this medicine, talk to your doctor, pharmacist, or health care provider.  2020 Elsevier/Gold Standard (2019-05-08 12:54:20)  

## 2019-12-24 NOTE — Progress Notes (Signed)
Weight loss appointment. Waist circumference= 37" Weight loss 10 lbs total, 4 lbs since last visit

## 2019-12-29 ENCOUNTER — Ambulatory Visit: Payer: Medicaid Other | Attending: Internal Medicine

## 2019-12-29 ENCOUNTER — Other Ambulatory Visit: Payer: Self-pay

## 2019-12-29 DIAGNOSIS — Z20822 Contact with and (suspected) exposure to covid-19: Secondary | ICD-10-CM

## 2019-12-30 ENCOUNTER — Other Ambulatory Visit: Payer: Medicaid Other

## 2019-12-30 LAB — SARS-COV-2, NAA 2 DAY TAT

## 2019-12-30 LAB — NOVEL CORONAVIRUS, NAA: SARS-CoV-2, NAA: NOT DETECTED

## 2020-01-20 ENCOUNTER — Telehealth: Payer: Self-pay | Admitting: Certified Nurse Midwife

## 2020-01-20 NOTE — Telephone Encounter (Signed)
Pt called in and stated that she needs a refill on her phentermine & pantoprazole. The pt said she needs to restart her weight loss. The pt uses [facility] in [place]. Please advise

## 2020-01-20 NOTE — Telephone Encounter (Signed)
Advise patient that she has appointment on 7/12, will discuss refills at that time. Thanks, JML

## 2020-01-20 NOTE — Telephone Encounter (Signed)
Called pt and informed her of this information. Pt verbally understood

## 2020-01-25 ENCOUNTER — Encounter: Payer: Self-pay | Admitting: Certified Nurse Midwife

## 2020-01-25 ENCOUNTER — Other Ambulatory Visit: Payer: Self-pay

## 2020-01-25 ENCOUNTER — Ambulatory Visit (INDEPENDENT_AMBULATORY_CARE_PROVIDER_SITE_OTHER): Payer: Medicaid Other | Admitting: Certified Nurse Midwife

## 2020-01-25 VITALS — BP 119/83 | HR 69 | Ht 65.0 in | Wt 191.0 lb

## 2020-01-25 DIAGNOSIS — Z713 Dietary counseling and surveillance: Secondary | ICD-10-CM | POA: Diagnosis not present

## 2020-01-25 DIAGNOSIS — Z7689 Persons encountering health services in other specified circumstances: Secondary | ICD-10-CM

## 2020-01-25 DIAGNOSIS — Z0130 Encounter for examination of blood pressure without abnormal findings: Secondary | ICD-10-CM | POA: Diagnosis not present

## 2020-01-25 DIAGNOSIS — Z6831 Body mass index (BMI) 31.0-31.9, adult: Secondary | ICD-10-CM

## 2020-01-25 MED ORDER — CYANOCOBALAMIN 1000 MCG/ML IJ SOLN
1000.0000 ug | Freq: Once | INTRAMUSCULAR | Status: AC
  Administered 2020-01-25: 1000 ug via INTRAMUSCULAR

## 2020-01-25 NOTE — Patient Instructions (Signed)
Phentermine tablets or capsules What is this medicine? PHENTERMINE (FEN ter meen) decreases your appetite. It is used with a reduced calorie diet and exercise to help you lose weight. This medicine may be used for other purposes; ask your health care provider or pharmacist if you have questions. COMMON BRAND NAME(S): Adipex-P, Atti-Plex P, Atti-Plex P Spansule, Fastin, Lomaira, Pro-Fast, Tara-8 What should I tell my health care provider before I take this medicine? They need to know if you have any of these conditions:  agitation or nervousness  diabetes  glaucoma  heart disease  high blood pressure  history of drug abuse or addiction  history of stroke  kidney disease  lung disease called Primary Pulmonary Hypertension (PPH)  taken an MAOI like Carbex, Eldepryl, Marplan, Nardil, or Parnate in last 14 days  taking stimulant medicines for attention disorders, weight loss, or to stay awake  thyroid disease  an unusual or allergic reaction to phentermine, other medicines, foods, dyes, or preservatives  pregnant or trying to get pregnant  breast-feeding How should I use this medicine? Take this medicine by mouth with a glass of water. Follow the directions on the prescription label. Take your medicine at regular intervals. Do not take it more often than directed. Do not stop taking except on your doctor's advice. Talk to your pediatrician regarding the use of this medicine in children. While this drug may be prescribed for children 17 years or older for selected conditions, precautions do apply. Overdosage: If you think you have taken too much of this medicine contact a poison control center or emergency room at once. NOTE: This medicine is only for you. Do not share this medicine with others. What if I miss a dose? If you miss a dose, take it as soon as you can. If it is almost time for your next dose, take only that dose. Do not take double or extra doses. What may interact  with this medicine? Do not take this medicine with any of the following medications:  MAOIs like Carbex, Eldepryl, Marplan, Nardil, and Parnate This medicine may also interact with the following medications:  alcohol  certain medicines for depression, anxiety, or psychotic disorders  certain medicines for high blood pressure  linezolid  medicines for colds or breathing difficulties like pseudoephedrine or phenylephrine  medicines for diabetes  sibutramine  stimulant medicines for attention disorders, weight loss, or to stay awake This list may not describe all possible interactions. Give your health care provider a list of all the medicines, herbs, non-prescription drugs, or dietary supplements you use. Also tell them if you smoke, drink alcohol, or use illegal drugs. Some items may interact with your medicine. What should I watch for while using this medicine? Visit your doctor or health care provider for regular checks on your progress. Do not stop taking except on your health care provider's advice. You may develop a severe reaction. Your health care provider will tell you how much medicine to take. Do not take this medicine close to bedtime. It may prevent you from sleeping. You may get drowsy or dizzy. Do not drive, use machinery, or do anything that needs mental alertness until you know how this medicine affects you. Do not stand or sit up quickly, especially if you are an older patient. This reduces the risk of dizzy or fainting spells. Alcohol may increase dizziness and drowsiness. Avoid alcoholic drinks. This medicine may affect blood sugar levels. Ask your healthcare provider if changes in diet or medicines are needed   if you have diabetes. Women should inform their health care provider if they wish to become pregnant or think they might be pregnant. Losing weight while pregnant is not advised and may cause harm to the unborn child. Talk to your health care provider for more  information. What side effects may I notice from receiving this medicine? Side effects that you should report to your doctor or health care professional as soon as possible:  allergic reactions like skin rash, itching or hives, swelling of the face, lips, or tongue  breathing problems  changes in emotions or moods  changes in vision  chest pain or chest tightness  fast, irregular heartbeat  feeling faint or lightheaded  increased blood pressure  irritable  restlessness  tremors  seizures  signs and symptoms of a stroke like changes in vision; confusion; trouble speaking or understanding; severe headaches; sudden numbness or weakness of the face, arm or leg; trouble walking; dizziness; loss of balance or coordination  unusually weak or tired Side effects that usually do not require medical attention (report to your doctor or health care professional if they continue or are bothersome):  changes in taste  constipation or diarrhea  dizziness  dry mouth  headache  trouble sleeping  upset stomach This list may not describe all possible side effects. Call your doctor for medical advice about side effects. You may report side effects to FDA at 1-800-FDA-1088. Where should I keep my medicine? Keep out of the reach of children. This medicine can be abused. Keep your medicine in a safe place to protect it from theft. Do not share this medicine with anyone. Selling or giving away this medicine is dangerous and against the law. This medicine may cause harm and death if it is taken by other adults, children, or pets. Return medicine that has not been used to an official disposal site. Contact the DEA at 1-800-882-9539 or your city/county government to find a site. If you cannot return the medicine, mix any unused medicine with a substance like cat litter or coffee grounds. Then throw the medicine away in a sealed container like a sealed bag or coffee can with a lid. Do not use the  medicine after the expiration date. Store at room temperature between 20 and 25 degrees C (68 and 77 degrees F). Keep container tightly closed. NOTE: This sheet is a summary. It may not cover all possible information. If you have questions about this medicine, talk to your doctor, pharmacist, or health care provider.  2020 Elsevier/Gold Standard (2019-05-08 12:54:20)  

## 2020-01-25 NOTE — Progress Notes (Signed)
Pt present for weight management. Pt that she was doing well. Pt stated having some side effects from the medication. Pt stated having dry mouth, dizziness and constipation while taking the medication phenetamine.  Last visit 12/24/2019 weight 192 lb.

## 2020-01-31 MED ORDER — PHENTERMINE HCL 37.5 MG PO CAPS: 37.5000 mg | ORAL_CAPSULE | ORAL | 0 refills | Status: DC

## 2020-01-31 NOTE — Progress Notes (Signed)
GYN ENCOUNTER NOTE  Subjective:       Yvonne Black is a 34 y.o. [phone] female here to restart weight management program.   Notes dry mouth, dizziness, and constipation when taking phentermine.   Denies difficulty breathing or respiratory distress, chest pain, abdominal pain, excessive vaginal bleeding, dysuria, and leg pain or swelling.    Gynecologic History  No LMP recorded (lmp unknown). (Menstrual status: IUD).  Contraception: IUD  Last Pap: 01/2019. Results were: Negative/Negative  Obstetric History  OB History  Gravida Para Term Preterm AB Living  2 1 1   1 2   SAB TAB Ectopic Multiple Live Births  1     1 2     # Outcome Date GA Lbr Len/2nd Weight Sex Delivery Anes PTL Lv  2A Term 05/03/10   4 lb 6 oz (1.984 kg) M CS-LTranv   LIV  2B Term 05/03/10   4 lb 4 oz (1.928 kg) M CS-LTranv Spinal N LIV  1 SAB 2008            Past Medical History:  Diagnosis Date  . Hormone disorder     Past Surgical History:  Procedure Laterality Date  . BREAST SURGERY  12/19/2016   Reduction  . CESAREAN SECTION  05/03/2010  . CYST REMOVAL HAND    . tummy tuck  12/19/2016    Current Outpatient Medications on File Prior to Visit  Medication Sig Dispense Refill  . pantoprazole (PROTONIX) 40 MG tablet Take 1 tablet (40 mg total) by mouth daily. 30 tablet 1  . phentermine 37.5 MG capsule Take 1 capsule (37.5 mg total) by mouth every morning. 30 capsule 0   No current facility-administered medications on file prior to visit.    Allergies  Allergen Reactions  . Iodine Anaphylaxis  . Latex Hives  . Penicillins     Social History   Socioeconomic History  . Marital status: Married    Spouse name: Not on file  . Number of children: Not on file  . Years of education: Not on file  . Highest education level: Not on file  Occupational History  . Not on file  Tobacco Use  . Smoking status: Never Smoker  . Smokeless tobacco: Never Used  Vaping Use  . Vaping Use: Never used   Substance and Sexual Activity  . Alcohol use: No  . Drug use: No  . Sexual activity: Not Currently    Partners: Male    Birth control/protection: None  Other Topics Concern  . Not on file  Social History Narrative  . Not on file   Social Determinants of Health   Financial Resource Strain:   . Difficulty of Paying Living Expenses:   Food Insecurity:   . Worried About 05/05/2010 in the Last Year:   . 02/18/2017 in the Last Year:   Transportation Needs:   . Programme researcher, broadcasting/film/video (Medical):   Barista Lack of Transportation (Non-Medical):   Physical Activity:   . Days of Exercise per Week:   . Minutes of Exercise per Session:   Stress:   . Feeling of Stress :   Social Connections:   . Frequency of Communication with Friends and Family:   . Frequency of Social Gatherings with Friends and Family:   . Attends Religious Services:   . Active Member of Clubs or Organizations:   . Attends Freight forwarder Meetings:   Marland Kitchen Marital Status:   Intimate Partner Violence:   . Fear  of Current or Ex-Partner:   . Emotionally Abused:   Marland Kitchen Physically Abused:   . Sexually Abused:     Family History  Problem Relation Age of Onset  . Breast cancer Other 50  . Diabetes Maternal Grandmother   . Hypertension Maternal Grandmother   . Diabetes Maternal Grandfather   . Hypertension Maternal Grandfather   . Healthy Mother   . Healthy Father   . Ovarian cancer Neg Hx   . Colon cancer Neg Hx     The following portions of the patient's history were reviewed and updated as appropriate: allergies, current medications, past family history, past medical history, past social history, past surgical history and problem list.  Review of Systems  ROS negative except as noted above. Information obtained from patient.   Objective:   BP 119/83   Pulse 69   Ht 5\' 5"  (1.651 m)   Wt 191 lb (86.6 kg)   LMP  (LMP Unknown)   BMI 31.78 kg/m    CONSTITUTIONAL: Well-developed, well-nourished  female in no acute distress.   ABDOMEN: Not change in waist circumference since previous visit.    Assessment:   1. Encounter for weight management   2. Blood pressure check   3. BMI 31.0-31.9,adult   Plan:   Discussed one (1) pound weight loss; patient did not pick up medication from pharmacy and prescription was returned to shelf.   B-12 injection given.   Rx: Phentermine, see orders.   Advised of last chance if weight loss dose not meet recommendations next visit, then patient will no longer be eligible for weight management program with me; verbalized understanding.   Reviewed red flag symptoms and when to call.   RTC x 1 month for weight check with ANNUAL EXAM (reschedule ANNUAL from next week)   03-31-1970, CNM [facility], [facility]

## 2020-02-01 ENCOUNTER — Encounter: Payer: Medicaid Other | Admitting: Certified Nurse Midwife

## 2020-02-26 ENCOUNTER — Other Ambulatory Visit: Payer: Self-pay | Admitting: Certified Nurse Midwife

## 2020-02-26 ENCOUNTER — Telehealth: Payer: Self-pay | Admitting: Certified Nurse Midwife

## 2020-02-26 DIAGNOSIS — B373 Candidiasis of vulva and vagina: Secondary | ICD-10-CM

## 2020-02-26 DIAGNOSIS — B3731 Acute candidiasis of vulva and vagina: Secondary | ICD-10-CM

## 2020-02-26 MED ORDER — FLUCONAZOLE 150 MG PO TABS: 150.0000 mg | ORAL_TABLET | Freq: Once | ORAL | 0 refills | Status: AC

## 2020-02-26 NOTE — Progress Notes (Signed)
Rx Diflucan, see orders.    Serafina Royals, CNM [facility], [facility] 02/26/20 4:50 PM

## 2020-02-26 NOTE — Telephone Encounter (Signed)
Prescription for Diflucan sent to pharmacy on file. Keep appointment as scheduled. Thanks, Serafina Royals, CNM

## 2020-02-26 NOTE — Telephone Encounter (Signed)
Pt called in and stated that she needs to move her appt. The pt got the 1st does of the covid shot and she stated she had a fever. The pt also stated that she needs some meds sent in that she has a yeast infection. The pt stated she uses the [facility] of [employer]. I told the pt I would send a message to the provider. The pt verbally understood

## 2020-02-29 ENCOUNTER — Encounter: Payer: Medicaid Other | Admitting: Certified Nurse Midwife

## 2020-03-04 ENCOUNTER — Encounter: Payer: Medicaid Other | Admitting: Certified Nurse Midwife

## 2020-03-17 NOTE — Telephone Encounter (Signed)
Please contact patient to schedule appointment for IUD check. Unable to feel strings. Thanks, Serafina Royals, CNM

## 2020-03-28 ENCOUNTER — Encounter: Payer: Medicaid Other | Admitting: Certified Nurse Midwife

## 2020-03-28 ENCOUNTER — Other Ambulatory Visit: Payer: Medicaid Other

## 2020-03-28 ENCOUNTER — Other Ambulatory Visit: Payer: Self-pay | Admitting: *Deleted

## 2020-03-28 DIAGNOSIS — Z20822 Contact with and (suspected) exposure to covid-19: Secondary | ICD-10-CM

## 2020-03-29 LAB — SARS-COV-2, NAA 2 DAY TAT

## 2020-03-29 LAB — NOVEL CORONAVIRUS, NAA: SARS-CoV-2, NAA: NOT DETECTED

## 2020-05-06 ENCOUNTER — Encounter: Payer: Medicaid Other | Admitting: Certified Nurse Midwife

## 2020-07-19 ENCOUNTER — Other Ambulatory Visit: Payer: Medicaid Other

## 2020-07-19 ENCOUNTER — Other Ambulatory Visit: Payer: Self-pay

## 2020-07-19 DIAGNOSIS — Z20822 Contact with and (suspected) exposure to covid-19: Secondary | ICD-10-CM

## 2020-07-25 LAB — SARS-COV-2, NAA 2 DAY TAT

## 2020-07-25 LAB — NOVEL CORONAVIRUS, NAA: SARS-CoV-2, NAA: NOT DETECTED

## 2020-08-23 ENCOUNTER — Ambulatory Visit: Payer: Self-pay | Admitting: Nurse Practitioner

## 2020-09-27 ENCOUNTER — Ambulatory Visit: Payer: Self-pay | Admitting: Internal Medicine

## 2020-09-30 ENCOUNTER — Ambulatory Visit (INDEPENDENT_AMBULATORY_CARE_PROVIDER_SITE_OTHER): Payer: Medicaid Other | Admitting: Certified Nurse Midwife

## 2020-09-30 ENCOUNTER — Other Ambulatory Visit: Payer: Self-pay

## 2020-09-30 ENCOUNTER — Encounter: Payer: Self-pay | Admitting: Certified Nurse Midwife

## 2020-09-30 VITALS — BP 131/81 | HR 73 | Ht 65.0 in | Wt 198.0 lb

## 2020-09-30 DIAGNOSIS — R5383 Other fatigue: Secondary | ICD-10-CM

## 2020-09-30 DIAGNOSIS — G43909 Migraine, unspecified, not intractable, without status migrainosus: Secondary | ICD-10-CM | POA: Diagnosis not present

## 2020-09-30 DIAGNOSIS — Z975 Presence of (intrauterine) contraceptive device: Secondary | ICD-10-CM | POA: Diagnosis not present

## 2020-09-30 DIAGNOSIS — R232 Flushing: Secondary | ICD-10-CM

## 2020-09-30 NOTE — Progress Notes (Signed)
Patient comes in today for IUD replacement. Her IUD was placed 10/2019. Patient is having hot flashes and that is why she thought it was time to have replaced.

## 2020-09-30 NOTE — Progress Notes (Signed)
GYN ENCOUNTER NOTE  Subjective:       Yvonne Black is a 35 y.o. G26P1012 female is here for gynecologic evaluation of the following issues:  1. Hot flashes, migraines and fatigue-patient originally scheduled appointment to have IUD removed and replaced due to symptoms  Denies difficulty breathing or respiratory distress, chest pain, abdominal pain, excessive vaginal bleeding, dysuria, and leg pain or swelling.    Gynecologic History  No LMP recorded (lmp unknown). (Menstrual status: IUD).  Contraception: IUD, Skyla placed 10/2019  Last Pap: 01/2019. Results were: Neg/Neg  Obstetric History  OB History  Gravida Para Term Preterm AB Living  2 1 1   1 2   SAB IAB Ectopic Multiple Live Births  1     1 2     # Outcome Date GA Lbr Len/2nd Weight Sex Delivery Anes PTL Lv  2A Term 05/03/10   4 lb 6 oz (1.984 kg) M CS-LTranv   LIV  2B Term 05/03/10   4 lb 4 oz (1.928 kg) M CS-LTranv Spinal N LIV  1 SAB 2008            Past Medical History:  Diagnosis Date  . Hormone disorder     Past Surgical History:  Procedure Laterality Date  . BREAST SURGERY  12/19/2016   Reduction  . CESAREAN SECTION  05/03/2010  . CYST REMOVAL HAND    . tummy tuck  12/19/2016    Current Outpatient Medications on File Prior to Visit  Medication Sig Dispense Refill  . pantoprazole (PROTONIX) 40 MG tablet Take 1 tablet (40 mg total) by mouth daily. (Patient not taking: Reported on 09/30/2020) 30 tablet 1  . phentermine 37.5 MG capsule Take 1 capsule (37.5 mg total) by mouth every morning. (Patient not taking: Reported on 09/30/2020) 30 capsule 0   No current facility-administered medications on file prior to visit.    Allergies  Allergen Reactions  . Iodine Anaphylaxis  . Latex Hives  . Penicillins     Social History   Socioeconomic History  . Marital status: Divorced    Spouse name: Not on file  . Number of children: Not on file  . Years of education: Not on file  . Highest education level: Not  on file  Occupational History  . Not on file  Tobacco Use  . Smoking status: Never Smoker  . Smokeless tobacco: Never Used  Vaping Use  . Vaping Use: Never used  Substance and Sexual Activity  . Alcohol use: No  . Drug use: No  . Sexual activity: Not Currently    Partners: Male    Birth control/protection: None  Other Topics Concern  . Not on file  Social History Narrative  . Not on file   Social Determinants of Health   Financial Resource Strain: Not on file  Food Insecurity: Not on file  Transportation Needs: Not on file  Physical Activity: Not on file  Stress: Not on file  Social Connections: Not on file  Intimate Partner Violence: Not on file    Family History  Problem Relation Age of Onset  . Breast cancer Other 50  . Diabetes Maternal Grandmother   . Hypertension Maternal Grandmother   . Diabetes Maternal Grandfather   . Hypertension Maternal Grandfather   . Healthy Mother   . Healthy Father   . Ovarian cancer Neg Hx   . Colon cancer Neg Hx     The following portions of the patient's history were reviewed and updated as appropriate: allergies,  current medications, past family history, past medical history, past social history, past surgical history and problem list.  Review of Systems  ROS negative except as noted above. Information obtained from patient.   Objective:   BP 131/81   Pulse 73   Ht 5\' 5"  (1.651 m)   Wt 198 lb (89.8 kg)   LMP  (LMP Unknown)   BMI 32.95 kg/m   CONSTITUTIONAL: Well-developed, well-nourished female in no acute distress.   PELVIC:  External Genitalia: Normal  Vagina: Normal  Cervix: Normal, IUD string present  MUSCULOSKELETAL: Normal range of motion. No tenderness.  No cyanosis, clubbing, or edema.  Assessment:   1. IUD (intrauterine device) in place   2. Hot flashes  - FSH/LH - TSH+T4F+T3Free - Estradiol - Progesterone - Testosterone, Free, Total, SHBG - VITAMIN D 25 Hydroxy (Vit-D Deficiency,  Fractures)  3. Other fatigue  - FSH/LH - TSH+T4F+T3Free - Estradiol - Progesterone - VITAMIN D 25 Hydroxy (Vit-D Deficiency, Fractures)  4. Migraine without status migrainosus, not intractable, unspecified migraine type  - FSH/LH - TSH+T4F+T3Free - Estradiol - Progesterone - Testosterone, Free, Total, SHBG - VITAMIN D 25 Hydroxy (Vit-D Deficiency, Fractures)     Plan:   After reviewing insertion date of Skyla IUD (approved for three years), patient agrees to leave IUD in place and evaluate hormone levels at this time.   Labs: see orders; will contact patient with results.   Reviewed red flag symptoms and when to call.   RTC for ANNUAL EXAM or sooner if needed.    , CNM [facility], [facility] 09/30/20 3:08 PM

## 2020-09-30 NOTE — Patient Instructions (Addendum)
Windy Fast of Endocrinology (14th ed., pp. [phone]). [place], [place]: Elsevier.">  Perimenopause Perimenopause is the normal time of a woman's life when the levels of estrogen, the female hormone produced by the ovaries, begin to decrease. This leads to changes in menstrual periods before they stop completely (menopause). Perimenopause can begin 2-8 years before menopause. During perimenopause, the ovaries may or may not produce an egg and a woman can still become pregnant. What are the causes? This condition is caused by a natural change in hormone levels that happens as you get older. What increases the risk? This condition is more likely to start at an earlier age if you have certain medical conditions or have undergone treatments, including:  A tumor of the pituitary gland in the brain.  A disease that affects the ovaries and hormone production.  Certain cancer treatments, such as chemotherapy or hormone therapy, or radiation therapy on the pelvis.  Heavy smoking and excessive alcohol use.  Family history of early menopause. What are the signs or symptoms? Perimenopausal changes affect each woman differently. Symptoms of this condition may include:  Hot flashes.  Irregular menstrual periods.  Night sweats.  Changes in feelings about sex. This could be a decrease in sex drive or an increased discomfort around your sexuality.  Vaginal dryness.  Headaches.  Mood swings.  Depression.  Problems sleeping (insomnia).  Memory problems or trouble concentrating.  Irritability.  Tiredness.  Weight gain.  Anxiety.  Trouble getting pregnant. How is this diagnosed? This condition is diagnosed based on your medical history, a physical exam, your age, your menstrual history, and your symptoms. Hormone tests may also be done. How is this treated? In some cases, no treatment is needed. You and your health care provider should make a decision together about whether  treatment is necessary. Treatment will be based on your individual condition and preferences. Various treatments are available, such as:  Menopausal hormone therapy (MHT).  Medicines to treat specific symptoms.  Acupuncture.  Vitamin or herbal supplements. Before starting treatment, make sure to let your health care provider know if you have a personal or family history of:  Heart disease.  Breast cancer.  Blood clots.  Diabetes.  Osteoporosis. Follow these instructions at home: Medicines  Take over-the-counter and prescription medicines only as told by your health care provider.  Take vitamin supplements only as told by your health care provider.  Talk with your health care provider before starting any herbal supplements. Lifestyle  Do not use any products that contain nicotine or tobacco, such as cigarettes, e-cigarettes, and chewing tobacco. If you need help quitting, ask your health care provider.  Get at least 30 minutes of physical activity on 5 or more days each week.  Eat a balanced diet that includes fresh fruits and vegetables, whole grains, soybeans, eggs, lean meat, and low-fat dairy.  Avoid alcoholic and caffeinated beverages, as well as spicy foods. This may help prevent hot flashes.  Get 7-8 hours of sleep each night.  Dress in layers that can be removed to help you manage hot flashes.  Find ways to manage stress, such as deep breathing, meditation, or journaling.   General instructions  Keep track of your menstrual periods, including: ? When they occur. ? How heavy they are and how long they last. ? How much time passes between periods.  Keep track of your symptoms, noting when they start, how often you have them, and how long they last.  Use vaginal lubricants or moisturizers to help  with vaginal dryness and improve comfort during sex.  You can still become pregnant if you are having irregular periods. Make sure you use contraception during  perimenopause if you do not want to get pregnant.  Keep all follow-up visits. This is important. This includes any group therapy or counseling.   Contact a health care provider if:  You have heavy vaginal bleeding or pass blood clots.  Your period lasts more than 2 days longer than normal.  Your periods are recurring sooner than 21 days.  You bleed after having sex.  You have pain during sex. Get help right away if you have:  Chest pain, trouble breathing, or trouble talking.  Severe depression.  Pain when you urinate.  Severe headaches.  Vision problems. Summary  Perimenopause is the time when a woman's body begins to move into menopause. This may happen naturally or as a result of other health problems or medical treatments.  Perimenopause can begin 2-8 years before menopause, and it can last for several years.  Perimenopausal symptoms can be managed through medicines, lifestyle changes, and complementary therapies such as acupuncture. This information is not intended to replace advice given to you by your health care provider. Make sure you discuss any questions you have with your health care provider. Document Revised: 12/17/2019 Document Reviewed: 12/17/2019 Elsevier Patient Education  2021 Elsevier Inc.    Levonorgestrel intrauterine device (IUD) What is this medicine? LEVONORGESTREL IUD (LEE voe nor jes trel) is a contraceptive (birth control) device. The device is placed inside the uterus by a health care provider. It is used to prevent pregnancy. Some devices can also be used to treat heavy bleeding that occurs during your period. This medicine may be used for other purposes; ask your health care provider or pharmacist if you have questions. COMMON BRAND NAME(S): Cameron Ali What should I tell my health care provider before I take this medicine? They need to know if you have any of these conditions:  abnormal Pap smear  cancer of the  breast, uterus, or cervix  diabetes  endometritis  genital or pelvic infection now or in the past  have more than one sexual partner or your partner has more than one partner  heart disease  history of an ectopic or tubal pregnancy  immune system problems  IUD in place  liver disease or tumor  problems with blood clots or take blood-thinners  seizures  use intravenous drugs  uterus of unusual shape  vaginal bleeding that has not been explained  an unusual or allergic reaction to levonorgestrel, other hormones, silicone, or polyethylene, medicines, foods, dyes, or preservatives  pregnant or trying to get pregnant  breast-feeding How should I use this medicine? This device is placed inside the uterus by a health care professional. Talk to your pediatrician regarding the use of this medicine in children. Special care may be needed. Overdosage: If you think you have taken too much of this medicine contact a poison control center or emergency room at once. NOTE: This medicine is only for you. Do not share this medicine with others. What if I miss a dose? This does not apply. Depending on the brand of device you have inserted, the device will need to be replaced every 3 to 7 years if you wish to continue using this type of birth control. What may interact with this medicine? Do not take this medicine with any of the following medications:  amprenavir  bosentan  fosamprenavir This medicine may also  interact with the following medications:  aprepitant  armodafinil  barbiturate medicines for inducing sleep or treating seizures  bexarotene  boceprevir  griseofulvin  medicines to treat seizures like carbamazepine, ethotoin, felbamate, oxcarbazepine, phenytoin, topiramate  modafinil  pioglitazone  rifabutin  rifampin  rifapentine  some medicines to treat HIV infection like atazanavir, efavirenz, indinavir, lopinavir, nelfinavir, tipranavir,  ritonavir  St. John's wort  warfarin This list may not describe all possible interactions. Give your health care provider a list of all the medicines, herbs, non-prescription drugs, or dietary supplements you use. Also tell them if you smoke, drink alcohol, or use illegal drugs. Some items may interact with your medicine. What should I watch for while using this medicine? Visit your doctor or health care professional for regular check ups. See your doctor if you or your partner has sexual contact with others, becomes HIV positive, or gets a sexual transmitted disease. This product does not protect you against HIV infection (AIDS) or other sexually transmitted diseases. You can check the placement of the IUD yourself by reaching up to the top of your vagina with clean fingers to feel the threads. Do not pull on the threads. It is a good habit to check placement after each menstrual period. Call your doctor right away if you feel more of the IUD than just the threads or if you cannot feel the threads at all. The IUD may come out by itself. You may become pregnant if the device comes out. If you notice that the IUD has come out use a backup birth control method like condoms and call your health care provider. Using tampons will not change the position of the IUD and are okay to use during your period. This IUD can be safely scanned with magnetic resonance imaging (MRI) only under specific conditions. Before you have an MRI, tell your healthcare provider that you have an IUD in place, and which type of IUD you have in place. What side effects may I notice from receiving this medicine? Side effects that you should report to your doctor or health care professional as soon as possible:  allergic reactions like skin rash, itching or hives, swelling of the face, lips, or tongue  fever, flu-like symptoms  genital sores  high blood pressure  no menstrual period for 6 weeks during use  pain, swelling,  warmth in the leg  pelvic pain or tenderness  severe or sudden headache  signs of pregnancy  stomach cramping  sudden shortness of breath  trouble with balance, talking, or walking  unusual vaginal bleeding, discharge  yellowing of the eyes or skin Side effects that usually do not require medical attention (report to your doctor or health care professional if they continue or are bothersome):  acne  breast pain  change in sex drive or performance  changes in weight  cramping, dizziness, or faintness while the device is being inserted  headache  irregular menstrual bleeding within first 3 to 6 months of use  nausea This list may not describe all possible side effects. Call your doctor for medical advice about side effects. You may report side effects to FDA at 1-800-FDA-1088. Where should I keep my medicine? This does not apply. NOTE: This sheet is a summary. It may not cover all possible information. If you have questions about this medicine, talk to your doctor, pharmacist, or health care provider.  2021 Elsevier/Gold Standard (2020-03-01 16:27:45)

## 2020-10-02 LAB — FSH/LH
FSH: 50.2 m[IU]/mL
LH: 28.8 m[IU]/mL

## 2020-10-02 LAB — TSH+T4F+T3FREE
Free T4: 1.17 ng/dL (ref 0.82–1.77)
T3, Free: 3.3 pg/mL (ref 2.0–4.4)
TSH: 0.991 u[IU]/mL (ref 0.450–4.500)

## 2020-10-02 LAB — TESTOSTERONE, FREE, TOTAL, SHBG
Sex Hormone Binding: 24.9 nmol/L (ref 24.6–122.0)
Testosterone, Free: 0.8 pg/mL (ref 0.0–4.2)
Testosterone: 3 ng/dL — ABNORMAL LOW (ref 8–60)

## 2020-10-02 LAB — ESTRADIOL: Estradiol: 12.6 pg/mL

## 2020-10-02 LAB — VITAMIN D 25 HYDROXY (VIT D DEFICIENCY, FRACTURES): Vit D, 25-Hydroxy: 22.6 ng/mL — ABNORMAL LOW (ref 30.0–100.0)

## 2020-10-02 LAB — PROGESTERONE: Progesterone: 0.1 ng/mL

## 2020-10-03 ENCOUNTER — Other Ambulatory Visit: Payer: Self-pay | Admitting: Certified Nurse Midwife

## 2020-10-03 DIAGNOSIS — E559 Vitamin D deficiency, unspecified: Secondary | ICD-10-CM

## 2020-10-03 MED ORDER — VITAMIN D (ERGOCALCIFEROL) 1.25 MG (50000 UNIT) PO CAPS: 50000.0000 [IU] | ORAL_CAPSULE | ORAL | 1 refills | Status: DC

## 2020-10-03 NOTE — Progress Notes (Signed)
Rx Vitamin D, see orders.    Serafina Royals, CNM [facility], [facility] 10/03/20 4:11 PM

## 2020-10-06 ENCOUNTER — Ambulatory Visit: Payer: Self-pay | Admitting: Family Medicine

## 2020-11-24 DIAGNOSIS — Z91013 Allergy to seafood: Secondary | ICD-10-CM | POA: Diagnosis not present

## 2020-11-24 DIAGNOSIS — Z880 Allergy status to penicillin: Secondary | ICD-10-CM | POA: Diagnosis not present

## 2020-11-24 DIAGNOSIS — E871 Hypo-osmolality and hyponatremia: Secondary | ICD-10-CM | POA: Diagnosis not present

## 2020-11-24 DIAGNOSIS — R509 Fever, unspecified: Secondary | ICD-10-CM | POA: Diagnosis not present

## 2020-11-24 DIAGNOSIS — A09 Infectious gastroenteritis and colitis, unspecified: Secondary | ICD-10-CM | POA: Diagnosis not present

## 2020-11-24 DIAGNOSIS — Z20822 Contact with and (suspected) exposure to covid-19: Secondary | ICD-10-CM | POA: Diagnosis not present

## 2020-11-24 DIAGNOSIS — E860 Dehydration: Secondary | ICD-10-CM | POA: Diagnosis not present

## 2020-11-28 ENCOUNTER — Ambulatory Visit: Payer: Medicaid Other | Admitting: Internal Medicine

## 2020-12-05 ENCOUNTER — Ambulatory Visit: Payer: Medicaid Other | Admitting: Internal Medicine

## 2020-12-09 ENCOUNTER — Other Ambulatory Visit: Payer: Self-pay

## 2020-12-09 ENCOUNTER — Ambulatory Visit (INDEPENDENT_AMBULATORY_CARE_PROVIDER_SITE_OTHER): Payer: Medicaid Other | Admitting: Family Medicine

## 2020-12-09 ENCOUNTER — Encounter: Payer: Self-pay | Admitting: Family Medicine

## 2020-12-09 VITALS — BP 134/82 | HR 88 | Ht 65.0 in | Wt 202.2 lb

## 2020-12-09 DIAGNOSIS — M546 Pain in thoracic spine: Secondary | ICD-10-CM

## 2020-12-09 DIAGNOSIS — G8929 Other chronic pain: Secondary | ICD-10-CM

## 2020-12-09 DIAGNOSIS — Z7689 Persons encountering health services in other specified circumstances: Secondary | ICD-10-CM | POA: Diagnosis not present

## 2020-12-09 NOTE — Assessment & Plan Note (Addendum)
Patient in today to establish care with new provider. She is doing well in general.  She has traveled recently to [place] and has had diarrhea. She has been advised to follow a brat diet.

## 2020-12-09 NOTE — Progress Notes (Signed)
Established Patient Office Visit  SUBJECTIVE:  Subjective  Patient ID: Yvonne Black, female    DOB: 1986/02/13  Age: 35 y.o. MRN: [id number]  CC:  Chief Complaint  Patient presents with  . New Patient (Initial Visit)  . Hospitalization Follow-up    Patient went to [place] and has had diarrhea since returning to [place].     HPI Yvonne Black is a 35 y.o. female presenting today for     Past Medical History:  Diagnosis Date  . Hormone disorder     Past Surgical History:  Procedure Laterality Date  . BREAST SURGERY  12/19/2016   Reduction  . CESAREAN SECTION  05/03/2010  . CYST REMOVAL HAND    . tummy tuck  12/19/2016    Family History  Problem Relation Age of Onset  . Breast cancer Other 50  . Diabetes Maternal Grandmother   . Hypertension Maternal Grandmother   . Diabetes Maternal Grandfather   . Hypertension Maternal Grandfather   . Healthy Mother   . Healthy Father   . Ovarian cancer Neg Hx   . Colon cancer Neg Hx     Social History   Socioeconomic History  . Marital status: Divorced    Spouse name: Not on file  . Number of children: Not on file  . Years of education: Not on file  . Highest education level: Not on file  Occupational History  . Not on file  Tobacco Use  . Smoking status: Never Smoker  . Smokeless tobacco: Never Used  Vaping Use  . Vaping Use: Never used  Substance and Sexual Activity  . Alcohol use: No  . Drug use: No  . Sexual activity: Not Currently    Partners: Male    Birth control/protection: None  Other Topics Concern  . Not on file  Social History Narrative  . Not on file   Social Determinants of Health   Financial Resource Strain: Not on file  Food Insecurity: Not on file  Transportation Needs: Not on file  Physical Activity: Not on file  Stress: Not on file  Social Connections: Not on file  Intimate Partner Violence: Not on file     Current Outpatient Medications:  .  pantoprazole (PROTONIX) 40 MG  tablet, Take 1 tablet (40 mg total) by mouth daily. (Patient not taking: Reported on 09/30/2020), Disp: 30 tablet, Rfl: 1 .  phentermine 37.5 MG capsule, Take 1 capsule (37.5 mg total) by mouth every morning. (Patient not taking: Reported on 09/30/2020), Disp: 30 capsule, Rfl: 0 .  Vitamin D, Ergocalciferol, (DRISDOL) 1.25 MG (50000 UNIT) CAPS capsule, Take 1 capsule (50,000 Units total) by mouth every 7 (seven) days., Disp: 14 capsule, Rfl: 1   Allergies  Allergen Reactions  . Iodine Anaphylaxis  . Latex Hives  . Penicillins     ROS Review of Systems  Constitutional: Negative.   HENT: Negative.   Genitourinary: Negative.   Musculoskeletal: Positive for back pain.  Neurological: Negative.   Psychiatric/Behavioral: Negative.      OBJECTIVE:    Physical Exam Vitals reviewed.  Constitutional:      Appearance: Normal appearance.  HENT:     Mouth/Throat:     Mouth: Mucous membranes are dry.  Cardiovascular:     Rate and Rhythm: Normal rate and regular rhythm.  Pulmonary:     Effort: Pulmonary effort is normal.  Neurological:     General: No focal deficit present.  Psychiatric:        Mood and  Affect: Mood normal.     BP 134/82   Pulse 88   Ht 5\' 5"  (1.651 m)   Wt 202 lb 3.2 oz (91.7 kg)   BMI 33.65 kg/m  Wt Readings from Last 3 Encounters:  12/09/20 202 lb 3.2 oz (91.7 kg)  09/30/20 198 lb (89.8 kg)  01/25/20 191 lb (86.6 kg)    Health Maintenance Due  Topic Date Due  . HIV Screening  Never done  . Hepatitis C Screening  Never done  . COVID-19 Vaccine (3 - Booster for Pfizer series) 08/07/2020    There are no preventive care reminders to display for this patient.  CBC Latest Ref Rng & Units 10/09/2019 04/08/2018 08/08/2017  WBC 3.4 - 10.8 x10E3/uL 5.0 4.3 4.1  Hemoglobin 11.1 - 15.9 g/dL 11.0(L) 11.3(L) 11.2  Hematocrit 34.0 - 46.6 % 32.7(L) 32.2(L) 33.7(L)  Platelets 150 - 450 x10E3/uL 252 253 241   CMP Latest Ref Rng & Units 04/08/2018 08/08/2017  Glucose 70  - 99 mg/dL 08/10/2017) 97  BUN 6 - 20 mg/dL 7 6  Creatinine 132(G - 1.00 mg/dL 4.01 0.27  Sodium 2.53 - 145 mmol/L 140 142  Potassium 3.5 - 5.1 mmol/L 3.7 4.2  Chloride 98 - 111 mmol/L 107 104  CO2 22 - 32 mmol/L 28 23  Calcium 8.9 - 10.3 mg/dL 8.9 9.3  Total Protein 6.5 - 8.1 g/dL 6.9 7.2  Total Bilirubin 0.3 - 1.2 mg/dL 0.6 0.4  Alkaline Phos 38 - 126 U/L 48 49  AST 15 - 41 U/L 18 12  ALT 0 - 44 U/L 16 9    Lab Results  Component Value Date   TSH 0.991 09/30/2020   Lab Results  Component Value Date   ALBUMIN 3.6 04/08/2018   ANIONGAP 5 04/08/2018   Lab Results  Component Value Date   CHOL 171 08/08/2017   HDL 54 08/08/2017   LDLCALC 105 (H) 08/08/2017   CHOLHDL 3.2 08/08/2017   Lab Results  Component Value Date   TRIG 58 08/08/2017   No results found for: HGBA1C    ASSESSMENT & PLAN:   Problem List Items Addressed This Visit      Other   Encounter to establish care - Primary    Patient in today to establish care with new provider. She is doing well in general.  She has traveled recently to 08/10/2017 and has had diarrhea. She has been advised to follow a brat diet.        Chronic midline thoracic back pain    During covid she has been doing more remote work and is having back pain.   Will refer to chiropractor.       Relevant Orders   Ambulatory referral to Chiropractic      No orders of the defined types were placed in this encounter.     Follow-up: No follow-ups on file.    [place], FNP [facility] [address], [place], [place]

## 2020-12-09 NOTE — Addendum Note (Signed)
Addended by: Irish Lack on: 12/09/2020 11:04 AM   Modules accepted: Level of Service

## 2020-12-09 NOTE — Assessment & Plan Note (Signed)
During covid she has been doing more remote work and is having back pain.   Will refer to chiropractor.

## 2021-03-17 ENCOUNTER — Encounter: Payer: Medicaid Other | Admitting: Family Medicine

## 2021-04-24 ENCOUNTER — Telehealth: Payer: Medicaid Other | Admitting: Family

## 2021-04-24 DIAGNOSIS — R399 Unspecified symptoms and signs involving the genitourinary system: Secondary | ICD-10-CM

## 2021-04-24 MED ORDER — NITROFURANTOIN MONOHYD MACRO 100 MG PO CAPS: 100.0000 mg | ORAL_CAPSULE | Freq: Two times a day (BID) | ORAL | 0 refills | Status: DC

## 2021-04-24 NOTE — Progress Notes (Signed)
Virtual Visit Consent   Yvonne Black, you are scheduled for a virtual visit with a [facility] provider today.     Just as with appointments in the office, your consent must be obtained to participate.  Your consent will be active for this visit and any virtual visit you may have with one of our providers in the next 365 days.     If you have a MyChart account, a copy of this consent can be sent to you electronically.  All virtual visits are billed to your insurance company just like a traditional visit in the office.    As this is a virtual visit, video technology does not allow for your provider to perform a traditional examination.  This may limit your provider's ability to fully assess your condition.  If your provider identifies any concerns that need to be evaluated in person or the need to arrange testing (such as labs, EKG, etc.), we will make arrangements to do so.     Although advances in technology are sophisticated, we cannot ensure that it will always work on either your end or our end.  If the connection with a video visit is poor, the visit may have to be switched to a telephone visit.  With either a video or telephone visit, we are not always able to ensure that we have a secure connection.     I need to obtain your verbal consent now.   Are you willing to proceed with your visit today?    Yvonne Black has provided verbal consent on 04/24/2021 for a virtual visit (video or telephone).   Jannifer Rodney, FNP   Date: 04/24/2021 7:46 PM   Virtual Visit via Video Note   I, Jannifer Rodney, connected with  Yvonne Black  ([id number], 12-23-1985) on 04/24/21 at  7:45 PM EDT by a video-enabled telemedicine application and verified that I am speaking with the correct person using two identifiers.  Location: Patient: Virtual Visit Location Patient: Home Provider: Virtual Visit Location Provider: Home   I discussed the limitations of evaluation and management by telemedicine and the  availability of in person appointments. The patient expressed understanding and agreed to proceed.    History of Present Illness: Yvonne Black is a 35 y.o. who identifies as a female who was assigned female at birth, and is being seen today for UTI symptoms.  HPI: Dysuria  This is a new problem. The current episode started in the past 7 days. The problem has been gradually worsening. The quality of the pain is described as burning. The pain is at a severity of 3/10. The pain is mild. There has been no fever. Associated symptoms include frequency and urgency. Pertinent negatives include no discharge, flank pain, hematuria, hesitancy, nausea or vomiting. She has tried increased fluids for the symptoms. The treatment provided mild relief.   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 1 po BId, Disp: 14 capsule, Rfl: 0   Vitamin D, Ergocalciferol, (DRISDOL) 1.25 MG (50000 UNIT) CAPS capsule, Take 1 capsule (50,000 Units total) by mouth every 7 (seven) days., Disp: 14 capsule, Rfl: 1  Observations/Objective: Patient is well-developed, well-nourished in no acute distress.  Resting comfortably  at home.  Head is normocephalic, atraumatic.  No labored breathing. Speech is clear and coherent with logical content.  Patient is alert and oriented  at baseline.    Assessment and Plan: 1. UTI symptoms - nitrofurantoin, macrocrystal-monohydrate, (MACROBID) 100 MG capsule; Take 1 capsule (100 mg total) by mouth 2 (two) times daily. 1 po BId  Dispense: 14 capsule; Refill: 0 Force fluids AZO over the counter X2 days RTO if symptoms worsen do not improve    Follow Up Instructions: I discussed the assessment and treatment plan with the patient. The patient was provided an opportunity to ask questions and all were answered. The patient agreed with the plan and demonstrated an understanding of the instructions.  A copy of instructions were sent to the patient via MyChart unless otherwise noted below.     The patient was advised to call back or seek an in-person evaluation if the symptoms worsen or if the condition fails to improve as anticipated.  Time:  I spent 12 minutes with the patient via telehealth technology discussing the above problems/concerns.    Jannifer Rodney, FNP

## 2021-04-24 NOTE — Patient Instructions (Signed)
Urinary Tract Infection, Adult A urinary tract infection (UTI) is an infection of any part of the urinary tract. The urinary tract includes the kidneys, ureters, bladder, and urethra. These organs make, store, and get rid of urine in the body. An upper UTI affects the ureters and kidneys. A lower UTI affects the bladder and urethra. What are the causes? Most urinary tract infections are caused by bacteria in your genital area around your urethra, where urine leaves your body. These bacteria grow and cause inflammation of your urinary tract. What increases the risk? You are more likely to develop this condition if: You have a urinary catheter that stays in place. You are not able to control when you urinate or have a bowel movement (incontinence). You are female and you: Use a spermicide or diaphragm for birth control. Have low estrogen levels. Are pregnant. You have certain genes that increase your risk. You are sexually active. You take antibiotic medicines. You have a condition that causes your flow of urine to slow down, such as: An enlarged prostate, if you are female. Blockage in your urethra. A kidney stone. A nerve condition that affects your bladder control (neurogenic bladder). Not getting enough to drink, or not urinating often. You have certain medical conditions, such as: Diabetes. A weak disease-fighting system (immunesystem). Sickle cell disease. Gout. Spinal cord injury. What are the signs or symptoms? Symptoms of this condition include: Needing to urinate right away (urgency). Frequent urination. This may include small amounts of urine each time you urinate. Pain or burning with urination. Blood in the urine. Urine that smells bad or unusual. Trouble urinating. Cloudy urine. Vaginal discharge, if you are female. Pain in the abdomen or the lower back. You may also have: Vomiting or a decreased appetite. Confusion. Irritability or tiredness. A fever or  chills. Diarrhea. The first symptom in older adults may be confusion. In some cases, they may not have any symptoms until the infection has worsened. How is this diagnosed? This condition is diagnosed based on your medical history and a physical exam. You may also have other tests, including: Urine tests. Blood tests. Tests for STIs (sexually transmitted infections). If you have had more than one UTI, a cystoscopy or imaging studies may be done to determine the cause of the infections. How is this treated? Treatment for this condition includes: Antibiotic medicine. Over-the-counter medicines to treat discomfort. Drinking enough water to stay hydrated. If you have frequent infections or have other conditions such as a kidney stone, you may need to see a health care provider who specializes in the urinary tract (urologist). In rare cases, urinary tract infections can cause sepsis. Sepsis is a life-threatening condition that occurs when the body responds to an infection. Sepsis is treated in the hospital with IV antibiotics, fluids, and other medicines. Follow these instructions at home: Medicines Take over-the-counter and prescription medicines only as told by your health care provider. If you were prescribed an antibiotic medicine, take it as told by your health care provider. Do not stop using the antibiotic even if you start to feel better. General instructions Make sure you: Empty your bladder often and completely. Do not hold urine for long periods of time. Empty your bladder after sex. Wipe from front to back after urinating or having a bowel movement if you are female. Use each tissue only one time when you wipe. Drink enough fluid to keep your urine pale yellow. Keep all follow-up visits. This is important. Contact a health care provider   if: Your symptoms do not get better after 1-2 days. Your symptoms go away and then return. Get help right away if: You have severe pain in your  back or your lower abdomen. You have a fever or chills. You have nausea or vomiting. Summary A urinary tract infection (UTI) is an infection of any part of the urinary tract, which includes the kidneys, ureters, bladder, and urethra. Most urinary tract infections are caused by bacteria in your genital area. Treatment for this condition often includes antibiotic medicines. If you were prescribed an antibiotic medicine, take it as told by your health care provider. Do not stop using the antibiotic even if you start to feel better. Keep all follow-up visits. This is important. This information is not intended to replace advice given to you by your health care provider. Make sure you discuss any questions you have with your health care provider. Document Revised: 02/12/2020 Document Reviewed: 02/12/2020 Elsevier Patient Education  2022 [employer]  

## 2021-06-22 ENCOUNTER — Encounter: Payer: Self-pay | Admitting: Emergency Medicine

## 2021-06-22 ENCOUNTER — Other Ambulatory Visit: Payer: Self-pay

## 2021-06-22 ENCOUNTER — Emergency Department
Admission: EM | Admit: 2021-06-22 | Discharge: 2021-06-22 | Disposition: A | Discharge status: Home / Self Care | Payer: Medicaid Other | Attending: Emergency Medicine | Admitting: Emergency Medicine

## 2021-06-22 ENCOUNTER — Emergency Department: Payer: Medicaid Other

## 2021-06-22 DIAGNOSIS — R519 Headache, unspecified: Secondary | ICD-10-CM | POA: Diagnosis not present

## 2021-06-22 DIAGNOSIS — M542 Cervicalgia: Secondary | ICD-10-CM | POA: Diagnosis not present

## 2021-06-22 DIAGNOSIS — R079 Chest pain, unspecified: Secondary | ICD-10-CM | POA: Insufficient documentation

## 2021-06-22 DIAGNOSIS — R42 Dizziness and giddiness: Secondary | ICD-10-CM | POA: Insufficient documentation

## 2021-06-22 DIAGNOSIS — Z5321 Procedure and treatment not carried out due to patient leaving prior to being seen by health care provider: Secondary | ICD-10-CM | POA: Insufficient documentation

## 2021-06-22 LAB — CBC
HCT: 36.5 % (ref 36.0–46.0)
Hemoglobin: 11.8 g/dL — ABNORMAL LOW (ref 12.0–15.0)
MCH: 28.7 pg (ref 26.0–34.0)
MCHC: 32.3 g/dL (ref 30.0–36.0)
MCV: 88.8 fL (ref 80.0–100.0)
Platelets: 224 10*3/uL (ref 150–400)
RBC: 4.11 MIL/uL (ref 3.87–5.11)
RDW: 13.3 % (ref 11.5–15.5)
WBC: 4.1 10*3/uL (ref 4.0–10.5)
nRBC: 0 % (ref 0.0–0.2)

## 2021-06-22 LAB — BASIC METABOLIC PANEL
Anion gap: 5 (ref 5–15)
BUN: 8 mg/dL (ref 6–20)
CO2: 26 mmol/L (ref 22–32)
Calcium: 9.4 mg/dL (ref 8.9–10.3)
Chloride: 106 mmol/L (ref 98–111)
Creatinine, Ser: 0.69 mg/dL (ref 0.44–1.00)
GFR, Estimated: >60 mL/min (ref >60)
Glucose, Bld: 109 mg/dL — ABNORMAL HIGH (ref 70–99)
Potassium: 3.8 mmol/L (ref 3.5–5.1)
Sodium: 137 mmol/L (ref 135–145)

## 2021-06-22 LAB — TROPONIN I (HIGH SENSITIVITY): Troponin I (High Sensitivity): <2 ng/L (ref <18)

## 2021-06-22 NOTE — ED Triage Notes (Signed)
Pt comes into the ED via POV c/o chest pain, dizziness, headache, and neck pain.  Pt states this has been ongoing for a couple months, but she doesn't have a PCP so she hasnt seen anyone for it.  Pt states the headache feels like a migraine, but she denies any h/o migraines.  Pt states "I just feel like my neck needs support".  Pt also c/o the dizziness and chest tightness.  Pt ambulatory to triage with even and unlabored respirations.

## 2021-06-22 NOTE — ED Notes (Signed)
Pt ambulatory to the front desk stating that she was going to go home because she had been waiting a few hours. Pt encouraged to stay and was informed that she is the longest wait. Pt states that it doesn't matter because she will have to wait when she gets to the back as well so she is just going to go home. Pt was informed we would take her off the board.

## 2021-06-23 ENCOUNTER — Telehealth: Payer: Self-pay

## 2021-06-23 NOTE — Telephone Encounter (Signed)
Transition Care Management Unsuccessful Follow-up Telephone Call  Date of discharge and from where:  06/22/2021-[facility]  Attempts:  1st Attempt  Reason for unsuccessful TCM follow-up call:  Left voice message

## 2021-06-26 NOTE — Telephone Encounter (Signed)
Transition Care Management Unsuccessful Follow-up Telephone Call  Date of discharge and from where:  06/22/2021 from [facility]  Attempts:  2nd Attempt  Reason for unsuccessful TCM follow-up call:  Unable to leave message

## 2021-06-27 DIAGNOSIS — E669 Obesity, unspecified: Secondary | ICD-10-CM | POA: Diagnosis not present

## 2021-06-27 DIAGNOSIS — Z823 Family history of stroke: Secondary | ICD-10-CM | POA: Diagnosis not present

## 2021-06-27 DIAGNOSIS — Z789 Other specified health status: Secondary | ICD-10-CM | POA: Diagnosis not present

## 2021-06-27 DIAGNOSIS — Z8249 Family history of ischemic heart disease and other diseases of the circulatory system: Secondary | ICD-10-CM | POA: Diagnosis not present

## 2021-06-27 DIAGNOSIS — Z833 Family history of diabetes mellitus: Secondary | ICD-10-CM | POA: Diagnosis not present

## 2021-06-27 DIAGNOSIS — Z91041 Radiographic dye allergy status: Secondary | ICD-10-CM | POA: Diagnosis not present

## 2021-06-27 DIAGNOSIS — Z809 Family history of malignant neoplasm, unspecified: Secondary | ICD-10-CM | POA: Diagnosis not present

## 2021-06-27 NOTE — Telephone Encounter (Signed)
Transition Care Management Unsuccessful Follow-up Telephone Call  Date of discharge and from where:  06/22/2021 from [facility]  Attempts:  3rd Attempt  Reason for unsuccessful TCM follow-up call:  Unable to reach patient

## 2021-07-13 ENCOUNTER — Ambulatory Visit: Payer: Medicaid Other | Admitting: Family

## 2021-07-28 ENCOUNTER — Ambulatory Visit (INDEPENDENT_AMBULATORY_CARE_PROVIDER_SITE_OTHER): Payer: Medicaid Other | Admitting: Family

## 2021-07-28 ENCOUNTER — Encounter: Payer: Self-pay | Admitting: Family

## 2021-07-28 ENCOUNTER — Other Ambulatory Visit: Payer: Self-pay

## 2021-07-28 VITALS — BP 140/80 | HR 64 | Temp 97.6°F | Resp 16 | Ht 65.0 in | Wt 206.4 lb

## 2021-07-28 DIAGNOSIS — M545 Low back pain, unspecified: Secondary | ICD-10-CM

## 2021-07-28 DIAGNOSIS — G8929 Other chronic pain: Secondary | ICD-10-CM

## 2021-07-28 DIAGNOSIS — K219 Gastro-esophageal reflux disease without esophagitis: Secondary | ICD-10-CM | POA: Diagnosis not present

## 2021-07-28 DIAGNOSIS — Z6834 Body mass index (BMI) 34.0-34.9, adult: Secondary | ICD-10-CM

## 2021-07-28 DIAGNOSIS — M79639 Pain in unspecified forearm: Secondary | ICD-10-CM | POA: Diagnosis not present

## 2021-07-28 DIAGNOSIS — M79643 Pain in unspecified hand: Secondary | ICD-10-CM | POA: Diagnosis not present

## 2021-07-28 DIAGNOSIS — Z113 Encounter for screening for infections with a predominantly sexual mode of transmission: Secondary | ICD-10-CM | POA: Diagnosis not present

## 2021-07-28 DIAGNOSIS — M7989 Other specified soft tissue disorders: Secondary | ICD-10-CM

## 2021-07-28 DIAGNOSIS — R3915 Urgency of urination: Secondary | ICD-10-CM | POA: Diagnosis not present

## 2021-07-28 DIAGNOSIS — Z7689 Persons encountering health services in other specified circumstances: Secondary | ICD-10-CM | POA: Diagnosis not present

## 2021-07-28 DIAGNOSIS — M25562 Pain in left knee: Secondary | ICD-10-CM

## 2021-07-28 DIAGNOSIS — M25512 Pain in left shoulder: Secondary | ICD-10-CM

## 2021-07-28 DIAGNOSIS — Z1159 Encounter for screening for other viral diseases: Secondary | ICD-10-CM

## 2021-07-28 DIAGNOSIS — K921 Melena: Secondary | ICD-10-CM | POA: Diagnosis not present

## 2021-07-28 DIAGNOSIS — M25561 Pain in right knee: Secondary | ICD-10-CM | POA: Diagnosis not present

## 2021-07-28 LAB — POCT URINALYSIS DIPSTICK
Bilirubin, UA: NEGATIVE
Blood, UA: NEGATIVE
Glucose, UA: NEGATIVE
Ketones, UA: NEGATIVE
Leukocytes, UA: NEGATIVE
Nitrite, UA: NEGATIVE
Protein, UA: NEGATIVE
Spec Grav, UA: >=1.03 — AB (ref 1.010–1.025)
Urobilinogen, UA: NEGATIVE E.U./dL — AB
pH, UA: 5 (ref 5.0–8.0)

## 2021-07-28 MED ORDER — OMEPRAZOLE 20 MG PO CPDR: 20.0000 mg | DELAYED_RELEASE_CAPSULE | Freq: Every day | ORAL | 3 refills | Status: DC

## 2021-07-28 NOTE — Progress Notes (Signed)
Provider: Demetric Dunnaway FNP-C   Beckie Salts, FNP  Patient Care Team: Beckie Salts, FNP as PCP - General [facility])  Extended Emergency Contact Information Primary Emergency Contact: Legrand Como Mobile Phone: [phone] Relation: Spouse  Code Status:  Full Code  Goals of care: Advanced Directive information Advanced Directives 07/28/2021  Does Patient Have a Medical Advance Directive? No  Would patient like information on creating a medical advance directive? No - Patient declined     Chief Complaint  Patient presents with   Establish Care    New Patient.     HPI:  Pt is a 36 y.o. female seen today to establish care here at [facility] for medical issues.  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 Request referral to chiropractic for pain management. Also concerned about her weight.states not loosing despite changing her diet and exercising.  Also concerned of blood in the stool.has had dark colored stool.denies any constipation.  No history of smoking. Does drink socially. Does light exercise 2-3 times per week   Past Medical History:  Diagnosis Date   Hormone disorder    Past Surgical History:  Procedure Laterality Date   BREAST SURGERY  12/19/2016   Reduction   CESAREAN SECTION  05/03/2010   CYST REMOVAL HAND     tummy tuck  12/19/2016    Allergies  Allergen Reactions   Iodine Anaphylaxis   Latex Hives   Penicillins    Shellfish Allergy     Allergies as of 07/28/2021       Reactions   Iodine Anaphylaxis   Latex Hives   Penicillins    Shellfish Allergy         Medication List        Accurate as of July 28, 2021  1:39 PM. If you have any questions, ask your nurse or doctor.          STOP taking these medications    nitrofurantoin (macrocrystal-monohydrate) 100 MG capsule Commonly known as:  Macrobid Stopped by: Sandrea Hughs, NP       TAKE these medications    Vitamin D (Ergocalciferol) 1.25 MG (50000 UNIT) Caps capsule Commonly known as: DRISDOL Take 1 capsule (50,000 Units total) by mouth every 7 (seven) days.        Review of Systems  Constitutional:  Positive for unexpected weight change. Negative for appetite change, chills, fatigue and fever.  HENT:  Negative for congestion, dental problem, ear discharge, ear pain, facial swelling, hearing loss, nosebleeds, postnasal drip, rhinorrhea, sinus pressure, sinus pain, sneezing, sore throat, tinnitus and trouble swallowing.   Eyes:  Negative for pain, discharge, redness, itching and visual disturbance.  Respiratory:  Negative for cough, chest tightness, shortness of breath and wheezing.   Cardiovascular:  Negative for chest pain, palpitations and leg swelling.  Gastrointestinal:  Negative for abdominal distention, abdominal pain, blood in stool, constipation, diarrhea, nausea and vomiting.  Endocrine: Negative for cold intolerance, heat intolerance, polydipsia, polyphagia and polyuria.  Genitourinary:  Positive for frequency and urgency. Negative for difficulty urinating, dysuria and flank pain.  Musculoskeletal:  Positive for arthralgias and joint swelling. Negative for back pain, gait problem, myalgias, neck pain and neck stiffness.  Skin:  Negative for color change, pallor, rash and wound.  Neurological:  Negative for dizziness, syncope, speech difficulty, weakness, light-headedness, numbness and headaches.  Hematological:  Does not bruise/bleed  easily.  Psychiatric/Behavioral:  Negative for agitation, behavioral problems, confusion, hallucinations, self-injury, sleep disturbance and suicidal ideas. The patient is not nervous/anxious.    Immunization History  Administered Date(s) Administered   Influenza-Unspecified 03/30/2010   PFIZER(Purple Top)SARS-COV-2 Vaccination 02/14/2020, 03/07/2020   Tdap 05/04/2010,  10/09/2019   Pertinent  Health Maintenance Due  Topic Date Due   INFLUENZA VACCINE  02/13/2021   PAP SMEAR-Modifier  01/21/2022   Fall Risk 04/08/2018 06/22/2021 07/28/2021  Falls in the past year? - - 0  Was there an injury with Fall? - - 0  Fall Risk Category Calculator - - 0  Fall Risk Category - - Low  Patient Fall Risk Level Low fall risk Low fall risk Low fall risk  Patient at Risk for Falls Due to - - No Fall Risks  Fall risk Follow up - - Falls evaluation completed   Functional Status Survey:    Vitals:   07/28/21 1326  BP: 140/80  Pulse: 64  Resp: 16  Temp: 97.6 F (36.4 C)  SpO2: 98%  Weight: 206 lb 6.4 oz (93.6 kg)  Height: 5' 5"  (1.651 m)   Body mass index is 34.35 kg/m. Physical Exam Vitals reviewed.  Constitutional:      General: She is not in acute distress.    Appearance: Normal appearance. She is obese. She is not ill-appearing or diaphoretic.  HENT:     Head: Normocephalic.     Right Ear: Tympanic membrane, ear canal and external ear normal. There is no impacted cerumen.     Left Ear: Tympanic membrane, ear canal and external ear normal. There is no impacted cerumen.     Nose: Nose normal. No congestion or rhinorrhea.     Mouth/Throat:     Mouth: Mucous membranes are moist.     Pharynx: Oropharynx is clear. No oropharyngeal exudate or posterior oropharyngeal erythema.  Eyes:     General: No scleral icterus.       Right eye: No discharge.        Left eye: No discharge.     Extraocular Movements: Extraocular movements intact.     Conjunctiva/sclera: Conjunctivae normal.     Pupils: Pupils are equal, round, and reactive to light.  Neck:     Vascular: No carotid bruit.  Cardiovascular:     Rate and Rhythm: Normal rate and regular rhythm.     Pulses: Normal pulses.     Heart sounds: Normal heart sounds. No murmur heard.   No friction rub. No gallop.  Pulmonary:     Effort: Pulmonary effort is normal. No respiratory distress.     Breath sounds:  Normal breath sounds. No wheezing, rhonchi or rales.  Chest:     Chest wall: No tenderness.  Abdominal:     General: Bowel sounds are normal. There is no distension.     Palpations: Abdomen is soft. There is no mass.     Tenderness: There is no abdominal tenderness. There is no right CVA tenderness, left CVA tenderness, guarding or rebound.  Musculoskeletal:        General: No swelling or tenderness. Normal range of motion.     Cervical back: Normal range of motion. No rigidity or tenderness.     Right lower leg: No edema.     Left lower leg: No edema.  Lymphadenopathy:     Cervical: No cervical adenopathy.  Skin:    General: Skin is warm and dry.     Coloration: Skin is not pale.  Findings: No bruising, erythema, lesion or rash.  Neurological:     Mental Status: She is alert and oriented to person, place, and time.     Cranial Nerves: No cranial nerve deficit.     Sensory: No sensory deficit.     Motor: No weakness.     Coordination: Coordination normal.     Gait: Gait normal.  Psychiatric:        Mood and Affect: Mood normal.        Speech: Speech normal.        Behavior: Behavior normal.        Thought Content: Thought content normal.        Judgment: Judgment normal.    Labs reviewed: Recent Labs    06/22/21 1304  NA 137  K 3.8  CL 106  CO2 26  GLUCOSE 109*  BUN 8  CREATININE 0.69  CALCIUM 9.4   No results for input(s): AST, ALT, ALKPHOS, BILITOT, PROT, ALBUMIN in the last 8760 hours. Recent Labs    06/22/21 1304  WBC 4.1  HGB 11.8*  HCT 36.5  MCV 88.8  PLT 224   Lab Results  Component Value Date   TSH 0.991 09/30/2020   No results found for: HGBA1C Lab Results  Component Value Date   CHOL 171 08/08/2017   HDL 54 08/08/2017   LDLCALC 105 (H) 08/08/2017   TRIG 58 08/08/2017   CHOLHDL 3.2 08/08/2017    Significant Diagnostic Results in last 30 days:  No results found.  Assessment/Plan 1. Encounter to establish care No medical records for  review advised to signed release of medical records from previous PCP then will update records. - CMP with eGFR([facility]) - TSH - Lipid Panel  2. Chronic left shoulder pain Continue OTC analgesic  Follow up with Chiropractor as requested - Ambulatory referral to Chiropractic  3. Chronic pain of both knees Continue OTC analgesic  Follow up with Chiropractor as requested - Ambulatory referral to Chiropractic  4. Blood in stool Will obtain stool for occult  - CBC with Differential/Platelet - Fecal Globin By Immunochemistry; Future  5. Urinary urgency Negative exam today - POC Urinalysis Dipstick results indicates yellow clear urine negative for blood,nitrites and leukocytes - Urine Culture  6. Gastroesophageal reflux disease without esophagitis Reports blood in the stool Check CBC/diff and iFOBT  - omeprazole (PRILOSEC) 20 MG capsule; Take 1 capsule (20 mg total) by mouth daily.  Dispense: 30 capsule; Refill: 3 - CBC with Differential/Platelet  7. BMI 34.0-34.9,adult BMI 34.35  -  Dietary modification and exercise at least 3 times per week for 30 minutes advised. - additional calorie counting for weight loss Education information provided on AVS  - Amb Ref to Medical Weight Management - CMP with eGFR([facility]) - TSH - Lipid Panel  8. Encounter for hepatitis C screening test for low risk patient Low risk - Hep C Antibody  9. Screen for STD (sexually transmitted disease) Reports no high risk behaviors  - HIV Antibody (routine testing w rflx)  10. Intermittent pain and swelling of hand Unclear etiology.no swelling today will rule out RA  - C-reactive protein - Rheumatoid Factor - Sedimentation rate  11. Chronic bilateral low back pain without sciatica Chronic  - continue current OTC analgesic as needed  - Ambulatory referral to Chiropractic  Family/ staff Communication: Reviewed plan of care with patient verbalized understanding   Labs/tests ordered:  - CBC with  Differential/Platelet - CMP with eGFR([facility]) - TSH - Lipid panel -  Hep C Antibody - HIV Antibody (routine testing w rflx)  - C-reactive protein - Rheumatoid Factor - Sedimentation rate  Next Appointment :  1 year for annual Physical exam with fasting labs    Sandrea Hughs, NP

## 2021-07-28 NOTE — Patient Instructions (Signed)

## 2021-07-31 LAB — HEPATITIS C ANTIBODY
Hepatitis C Ab: NONREACTIVE
SIGNAL TO CUT-OFF: 0.13 (ref <1.00)

## 2021-07-31 LAB — CBC WITH DIFFERENTIAL/PLATELET
Absolute Monocytes: 254 cells/uL (ref 200–950)
Basophils Absolute: 19 cells/uL (ref 0–200)
Basophils Relative: 0.4 %
Eosinophils Absolute: 62 cells/uL (ref 15–500)
Eosinophils Relative: 1.3 %
HCT: 37.7 % (ref 35.0–45.0)
Hemoglobin: 12.4 g/dL (ref 11.7–15.5)
Lymphs Abs: 1795 cells/uL (ref 850–3900)
MCH: 28.9 pg (ref 27.0–33.0)
MCHC: 32.9 g/dL (ref 32.0–36.0)
MCV: 87.9 fL (ref 80.0–100.0)
MPV: 10.1 fL (ref 7.5–12.5)
Monocytes Relative: 5.3 %
Neutro Abs: 2669 cells/uL (ref 1500–7800)
Neutrophils Relative %: 55.6 %
Platelets: 261 10*3/uL (ref 140–400)
RBC: 4.29 10*6/uL (ref 3.80–5.10)
RDW: 12.4 % (ref 11.0–15.0)
Total Lymphocyte: 37.4 %
WBC: 4.8 10*3/uL (ref 3.8–10.8)

## 2021-07-31 LAB — COMPLETE METABOLIC PANEL WITH GFR
AG Ratio: 1.4 (calc) (ref 1.0–2.5)
ALT: 27 U/L (ref 6–29)
AST: 28 U/L (ref 10–30)
Albumin: 4.6 g/dL (ref 3.6–5.1)
Alkaline phosphatase (APISO): 58 U/L (ref 31–125)
BUN: 10 mg/dL (ref 7–25)
CO2: 26 mmol/L (ref 20–32)
Calcium: 9.8 mg/dL (ref 8.6–10.2)
Chloride: 103 mmol/L (ref 98–110)
Creat: 0.88 mg/dL (ref 0.50–0.97)
Globulin: 3.2 g/dL (calc) (ref 1.9–3.7)
Glucose, Bld: 83 mg/dL (ref 65–99)
Potassium: 3.9 mmol/L (ref 3.5–5.3)
Sodium: 138 mmol/L (ref 135–146)
Total Bilirubin: 0.5 mg/dL (ref 0.2–1.2)
Total Protein: 7.8 g/dL (ref 6.1–8.1)
eGFR: 87 mL/min/{1.73_m2} (ref >=60)

## 2021-07-31 LAB — RHEUMATOID FACTOR: Rheumatoid fact SerPl-aCnc: <14 IU/mL (ref <14)

## 2021-07-31 LAB — SEDIMENTATION RATE: Sed Rate: 6 mm/h (ref 0–20)

## 2021-07-31 LAB — LIPID PANEL
Cholesterol: 226 mg/dL — ABNORMAL HIGH (ref <200)
HDL: 62 mg/dL (ref >=50)
LDL Cholesterol (Calc): 149 mg/dL (calc) — ABNORMAL HIGH
Non-HDL Cholesterol (Calc): 164 mg/dL (calc) — ABNORMAL HIGH (ref <130)
Total CHOL/HDL Ratio: 3.6 (calc) (ref <5.0)
Triglycerides: 57 mg/dL (ref <150)

## 2021-07-31 LAB — C-REACTIVE PROTEIN: CRP: 4.8 mg/L (ref <8.0)

## 2021-07-31 LAB — TSH: TSH: 1.13 mIU/L

## 2021-07-31 LAB — HIV ANTIBODY (ROUTINE TESTING W REFLEX): HIV 1&2 Ab, 4th Generation: NONREACTIVE

## 2021-08-15 ENCOUNTER — Other Ambulatory Visit (HOSPITAL_COMMUNITY)
Admission: RE | Admit: 2021-08-15 | Discharge: 2021-08-15 | Disposition: A | Discharge status: Home / Self Care | Payer: Medicaid Other | Source: Ambulatory Visit | Attending: Obstetrics | Admitting: Obstetrics

## 2021-08-15 ENCOUNTER — Ambulatory Visit (INDEPENDENT_AMBULATORY_CARE_PROVIDER_SITE_OTHER): Payer: Medicaid Other | Admitting: Obstetrics

## 2021-08-15 ENCOUNTER — Encounter: Payer: Self-pay | Admitting: Obstetrics

## 2021-08-15 ENCOUNTER — Other Ambulatory Visit: Payer: Self-pay

## 2021-08-15 VITALS — BP 123/85 | HR 73 | Wt 214.8 lb

## 2021-08-15 DIAGNOSIS — E669 Obesity, unspecified: Secondary | ICD-10-CM

## 2021-08-15 DIAGNOSIS — Z01419 Encounter for gynecological examination (general) (routine) without abnormal findings: Secondary | ICD-10-CM

## 2021-08-15 DIAGNOSIS — N898 Other specified noninflammatory disorders of vagina: Secondary | ICD-10-CM

## 2021-08-15 MED ORDER — PHENTERMINE HCL 37.5 MG PO CAPS: 37.5000 mg | ORAL_CAPSULE | ORAL | 2 refills | Status: DC

## 2021-08-15 NOTE — Progress Notes (Signed)
New GYN is in the office for annual Last pap: 01-22-2019 Currently has IUD, considering removal due to irregular bleeding. IUD placed 10-23-2019

## 2021-08-15 NOTE — Progress Notes (Signed)
Subjective:        Yvonne Black is a 36 y.o. female here for a routine exam.  Current complaints: Vaginal discharge.    Personal health questionnaire:  Is patient Ashkenazi Jewish, have a family history of breast and/or ovarian cancer: yes Is there a family history of uterine cancer diagnosed at age < 41, gastrointestinal cancer, urinary tract cancer, family member who is a Personnel officer syndrome-associated carrier: no Is the patient overweight and hypertensive, family history of diabetes, personal history of gestational diabetes, preeclampsia or PCOS: yes Is patient over 8, have PCOS,  family history of premature CHD under age 53, diabetes, smoke, have hypertension or peripheral artery disease:  no At any time, has a partner hit, kicked or otherwise hurt or frightened you?: no Over the past 2 weeks, have you felt down, depressed or hopeless?: no Over the past 2 weeks, have you felt little interest or pleasure in doing things?:no   Gynecologic History No LMP recorded. (Menstrual status: IUD). Contraception: IUD Last Pap: 01-22-2019. Results were: normal Last mammogram: n/a. Results were: n/a  Obstetric History OB History  Gravida Para Term Preterm AB Living  2 1 1   1 2   SAB IAB Ectopic Multiple Live Births  1     1 2     # Outcome Date GA Lbr Len/2nd Weight Sex Delivery Anes PTL Lv  2A Term 05/03/10   4 lb 6 oz (1.984 kg) M CS-LTranv   LIV  2B Term 05/03/10   4 lb 4 oz (1.928 kg) M CS-LTranv Spinal N LIV  1 SAB 2008            Past Medical History:  Diagnosis Date   Hormone disorder     Past Surgical History:  Procedure Laterality Date   BREAST SURGERY  12/19/2016   Reduction   CESAREAN SECTION  05/03/2010   CYST REMOVAL HAND     tummy tuck  12/19/2016     Current Outpatient Medications:    phentermine 37.5 MG capsule, Take 1 capsule (37.5 mg total) by mouth every morning., Disp: 30 capsule, Rfl: 2   omeprazole (PRILOSEC) 20 MG capsule, Take 1 capsule (20 mg total) by  mouth daily. (Patient not taking: Reported on 08/15/2021), Disp: 30 capsule, Rfl: 3   Vitamin D, Ergocalciferol, (DRISDOL) 1.25 MG (50000 UNIT) CAPS capsule, Take 1 capsule (50,000 Units total) by mouth every 7 (seven) days. (Patient not taking: Reported on 08/15/2021), Disp: 14 capsule, Rfl: 1 Allergies  Allergen Reactions   Iodine Anaphylaxis   Latex Hives   Penicillins    Shellfish Allergy     Social History   Tobacco Use   Smoking status: Never   Smokeless tobacco: Never  Substance Use Topics   Alcohol use: Yes    Comment: socially    Family History  Problem Relation Age of Onset   Breast cancer Other 50   Diabetes Maternal Grandmother    Hypertension Maternal Grandmother    Diabetes Maternal Grandfather    Hypertension Maternal Grandfather    Healthy Mother    Healthy Father    Ovarian cancer Neg Hx    Colon cancer Neg Hx       Review of Systems  Constitutional: negative for fatigue and weight loss Respiratory: negative for cough and wheezing Cardiovascular: negative for chest pain, fatigue and palpitations Gastrointestinal: negative for abdominal pain and change in bowel habits Musculoskeletal:negative for myalgias Neurological: negative for gait problems and tremors Behavioral/Psych: negative for abusive relationship, depression  Endocrine: negative for temperature intolerance    Genitourinary:negative for abnormal menstrual periods, genital lesions, hot flashes, sexual problems and vaginal discharge Integument/breast: negative for breast lump, breast tenderness, nipple discharge and skin lesion(s)    Objective:       BP 123/85    Pulse 73    Wt 214 lb 12.8 oz (97.4 kg)    BMI 35.74 kg/m  General:   Alert and no distress  Skin:   no rash or abnormalities  Lungs:   clear to auscultation bilaterally  Heart:   regular rate and rhythm, S1, S2 normal, no murmur, click, rub or gallop  Breasts:   normal without suspicious masses, skin or nipple changes or axillary  nodes  Abdomen:  normal findings: no organomegaly, soft, non-tender and no hernia  Pelvis:  External genitalia: normal general appearance Urinary system: urethral meatus normal and bladder without fullness, nontender Vaginal: normal without tenderness, induration or masses Cervix: normal appearance Adnexa: normal bimanual exam Uterus: anteverted and non-tender, normal size   Lab Review Urine pregnancy test Labs reviewed yes Radiologic studies reviewed no  I have spent a total of 20 minutes of face-to-face time, excluding clinical staff time, reviewing notes and preparing to see patient, ordering tests and/or medications, and counseling the patient.   Assessment:    1. Encounter for gynecological examination with Papanicolaou smear of cervix Rx: - Cytology - PAP( [place])  2. Vaginal discharge Rx: - Cervicovaginal ancillary only( [place])  3. Obesity (BMI 35.0-39.9 without comorbidity) Rx: - phentermine 37.5 MG capsule; Take 1 capsule (37.5 mg total) by mouth every morning.  Dispense: 30 capsule; Refill: 2     Plan:    Education reviewed: calcium supplements, depression evaluation, low fat, low cholesterol diet, safe sex/STD prevention, self breast exams, and weight bearing exercise. Contraception: IUD. Follow up in: 1 year.   Meds ordered this encounter  Medications   phentermine 37.5 MG capsule    Sig: Take 1 capsule (37.5 mg total) by mouth every morning.    Dispense:  30 capsule    Refill:  2      Brock Bad, MD 08/15/2021 3:08 PM

## 2021-08-16 LAB — CERVICOVAGINAL ANCILLARY ONLY
Bacterial Vaginitis (gardnerella): POSITIVE — AB
Candida Glabrata: NEGATIVE
Candida Vaginitis: NEGATIVE
Chlamydia: NEGATIVE
Comment: NEGATIVE
Comment: NEGATIVE
Comment: NEGATIVE
Comment: NEGATIVE
Comment: NEGATIVE
Comment: NORMAL
Neisseria Gonorrhea: NEGATIVE
Trichomonas: NEGATIVE

## 2021-08-17 ENCOUNTER — Other Ambulatory Visit: Payer: Self-pay | Admitting: Obstetrics

## 2021-08-17 DIAGNOSIS — N760 Acute vaginitis: Secondary | ICD-10-CM

## 2021-08-17 DIAGNOSIS — B9689 Other specified bacterial agents as the cause of diseases classified elsewhere: Secondary | ICD-10-CM

## 2021-08-17 MED ORDER — METRONIDAZOLE 500 MG PO TABS: 500.0000 mg | ORAL_TABLET | Freq: Two times a day (BID) | ORAL | 2 refills | Status: DC

## 2021-08-18 ENCOUNTER — Encounter: Payer: Self-pay | Admitting: Obstetrics

## 2021-08-18 LAB — CYTOLOGY - PAP
Comment: NEGATIVE
Comment: NEGATIVE
Diagnosis: NEGATIVE
HPV 16: NEGATIVE
HPV 18 / 45: NEGATIVE
High risk HPV: POSITIVE — AB

## 2021-08-22 ENCOUNTER — Encounter: Payer: Self-pay | Admitting: *Deleted

## 2021-08-22 NOTE — Progress Notes (Signed)
TC to patient to inform of abnormal pap and recommendation for colpo. Patient verbalized understanding. Has appt 08/24/21, will add colpo to visit per Dr. Verdell Carmine OK. MyChart message sent with education on colpo.

## 2021-08-24 ENCOUNTER — Ambulatory Visit (INDEPENDENT_AMBULATORY_CARE_PROVIDER_SITE_OTHER): Payer: Medicaid Other | Admitting: Obstetrics

## 2021-08-24 ENCOUNTER — Encounter: Payer: Self-pay | Admitting: Obstetrics

## 2021-08-24 ENCOUNTER — Other Ambulatory Visit (HOSPITAL_COMMUNITY)
Admission: RE | Admit: 2021-08-24 | Discharge: 2021-08-24 | Disposition: A | Discharge status: Home / Self Care | Payer: Medicaid Other | Source: Ambulatory Visit | Attending: Obstetrics | Admitting: Obstetrics

## 2021-08-24 ENCOUNTER — Other Ambulatory Visit: Payer: Self-pay

## 2021-08-24 VITALS — BP 124/82 | HR 71 | Ht 65.0 in | Wt 204.4 lb

## 2021-08-24 DIAGNOSIS — N870 Mild cervical dysplasia: Secondary | ICD-10-CM | POA: Diagnosis not present

## 2021-08-24 DIAGNOSIS — Z01812 Encounter for preprocedural laboratory examination: Secondary | ICD-10-CM | POA: Diagnosis not present

## 2021-08-24 DIAGNOSIS — R87810 Cervical high risk human papillomavirus (HPV) DNA test positive: Secondary | ICD-10-CM | POA: Diagnosis not present

## 2021-08-24 DIAGNOSIS — Z30432 Encounter for removal of intrauterine contraceptive device: Secondary | ICD-10-CM | POA: Diagnosis not present

## 2021-08-24 LAB — POCT URINE PREGNANCY: Preg Test, Ur: NEGATIVE

## 2021-08-24 NOTE — Progress Notes (Signed)
Patient presents for IUD removal and Colpo for HRHPV. Patient would like IUD removed so that she can try to conceive. Patient has no other concerns.

## 2021-08-24 NOTE — Progress Notes (Signed)
° ° °  GYNECOLOGY OFFICE PROCEDURE NOTE  Yvonne Black is a 36 y.o. [id number] here for Liletta IUD removal. No GYN concerns.  Last pap smear was on 08-15-2021 and was abnormal.  IUD Removal  Patient identified, informed consent performed, consent signed.  Patient was in the dorsal lithotomy position, normal external genitalia was noted.  A speculum was placed in the patient's vagina, normal discharge was noted, no lesions. The cervix was visualized, no lesions, no abnormal discharge.  The strings of the IUD were grasped and pulled using ring forceps. The IUD was removed in its entirety.  Patient tolerated the procedure well.    Patient will use nothing for contraception/.  Pans for pregnancy soon and she was told to avoid teratogens, take PNV and folic acid.  Routine preventative health maintenance measures emphasized.   Geffrey Michaelsen a. Clearance Coots, MD, FACOG Obstetrician & Gynecologist, [facility] for [facility], [place] 08/24/21     Colposcopy Procedure Note  Indications: Pap smear 1 months ago showed:  NILM withpositive HRHPV . The prior pap showed no abnormalities.  Prior cervical/vaginal disease: normal exam without visible pathology. Prior cervical treatment: no treatment.  Procedure Details  The risks and benefits of the procedure and Written informed consent obtained.  A time-out was performed confirming the patient, procedure and allergy status  Speculum placed in vagina and excellent visualization of cervix achieved, cervix swabbed x 3 with acetic acid solution.  Findings: Cervix: no visible lesions, no mosaicism, no punctation, and no abnormal vasculature; SCJ visualized 360 degrees without lesions, endocervical curettage performed, cervical biopsies taken at 6 and 12 o'clock, specimen labelled and sent to pathology, and hemostasis achieved with silver nitrate.   Vaginal inspection: normal without visible lesions. Vulvar colposcopy: vulvar  colposcopy not performed.   Physical Exam   Specimens: ECC and Cervical Biopsies  Complications: none.  Plan: Specimens labelled and sent to Pathology. Will base further treatment on Pathology findings. Treatment options discussed with patient. Post biopsy instructions given to patient. Return to discuss Pathology results in 2 weeks.    Brock Bad, MD 08/24/2021 4:11 PM

## 2021-08-28 LAB — SURGICAL PATHOLOGY

## 2021-09-07 ENCOUNTER — Ambulatory Visit (INDEPENDENT_AMBULATORY_CARE_PROVIDER_SITE_OTHER): Payer: Medicaid Other | Admitting: Obstetrics

## 2021-09-07 ENCOUNTER — Encounter: Payer: Self-pay | Admitting: Obstetrics

## 2021-09-07 DIAGNOSIS — N870 Mild cervical dysplasia: Secondary | ICD-10-CM | POA: Diagnosis not present

## 2021-09-07 NOTE — Progress Notes (Signed)
TELEHEALTH GYNECOLOGY VISIT ENCOUNTER NOTE  Provider location: Center for [facility]   Patient location: Home  I connected with Yvonne Black on 09/07/21 at 10:35 AM EST by telephone and verified that I am speaking with the correct person using two identifiers. Patient was unable to do MyChart audiovisual encounter due to technical difficulties, she tried several times.    I discussed the limitations, risks, security and privacy concerns of performing an evaluation and management service by telephone and the availability of in person appointments. I also discussed with the patient that there may be a patient responsible charge related to this service. The patient expressed understanding and agreed to proceed.   History:  Yvonne Black is a 36 y.o. [phone] female being evaluated today for results of colposcopic biopsies. Pap smear showed NILM with positive HRHPV.  She denies any abnormal vaginal discharge, bleeding, pelvic pain or other concerns.       Past Medical History:  Diagnosis Date   Hormone disorder    Past Surgical History:  Procedure Laterality Date   BREAST SURGERY  12/19/2016   Reduction   CESAREAN SECTION  05/03/2010   CYST REMOVAL HAND     tummy tuck  12/19/2016   The following portions of the patient's history were reviewed and updated as appropriate: allergies, current medications, past family history, past medical history, past social history, past surgical history and problem list.   Health Maintenance:  NILM pap and positive HRHPV on 08-15-2021.    Review of Systems:  Pertinent items noted in HPI and remainder of comprehensive ROS otherwise negative.  Physical Exam:   General:  Alert, oriented and cooperative.   Mental Status: Normal mood and affect perceived. Normal judgment and thought content.  Physical exam deferred due to nature of the encounter  Labs and Imaging Results for orders placed or performed in visit on 08/24/21 (from the past  336 hour(s))  Surgical pathology( [place]/ [facility])   Collection Time: 08/24/21  4:08 PM  Result Value Ref Range   SURGICAL PATHOLOGY      SURGICAL PATHOLOGY CASE: [id number] PATIENT: Fauna Calvert Surgical Pathology Report     Clinical History: Papanicolaou smear of cervix with positive high risk HPV (nt)     FINAL MICROSCOPIC DIAGNOSIS:  A. ENDOCERVICAL, CURETTAGE: - Koilocytic atypia, consistent with low-grade squamous intraepithelial lesion (CIN1, low-grade dysplasia) - Benign cervical glandular mucosa  B. CERVIX, 2 AND 6 O'CLOCK, BIOPSY: - Koilocytic atypia, consistent with low-grade squamous intraepithelial lesion (CIN1, low-grade dysplasia)      GROSS DESCRIPTION:  A: The specimen is received in formalin and consists of a 1.5 x 0.8 x 0.3 cm aggregate of tan-brown soft tissue and mucus.  The specimen is entirely submitted in 1 cassette.  B: Specimen is received in formalin and consists of 2 pieces of tan soft tissue, measuring 0.3 and 0.4 cm in greatest dimension.  The specimen is entirely submitted in 1 cassette.  Lovey Newcomer 08/25/2021)    Final Diagnosis performed by Justice Deeds ar, MD.   Electronically signed 08/28/2021 Technical component performed at [facility]. [facility], [address] [place], [place] [address].  Professional component performed at [facility], [address] [place], [place] [address].  Immunohistochemistry Technical component (if applicable) was performed at [facility]. [address], [place], [place] [address].   IMMUNOHISTOCHEMISTRY DISCLAIMER (if applicable): Some of these immunohistochemical stains may have been developed and the performance characteristics determine by [facility]. Some may not have been cleared or approved by the U.S. Food and Drug Administration. The FDA has determined that such clearance or approval is not necessary. This test is  used for clinical purposes. It should not be regarded as investigational or for research. This laboratory is certified under the Clinical Laboratory Improvement Amendments of 1988 (CLIA-88) as quali fied to perform high complexity clinical laboratory testing.  The controls stained appropriately.   POCT urine pregnancy   Collection Time: 08/24/21  4:33 PM  Result Value Ref Range   Preg Test, Ur Negative Negative   No results found.    Assessment and Plan:     1. Mild dysplasia of cervix (CIN I) - repeat pap in 1 year       I discussed the assessment and treatment plan with the patient. The patient was provided an opportunity to ask questions and all were answered. The patient agreed with the plan and demonstrated an understanding of the instructions.   The patient was advised to call back or seek an in-person evaluation/go to the ED if the symptoms worsen or if the condition fails to improve as anticipated.  I have spent a total of 10 minutes of non-face-to-face time, excluding clinical staff time, reviewing notes and preparing to see patient, ordering tests and/or medications, and counseling the patient.     Coral Ceo, MD Center for [facility], [facility], [facility] 09/07/21

## 2021-09-07 NOTE — Telephone Encounter (Signed)
Yvonne Black,can you clarify who you spoke to that does not take your Insurance.we referred to several specialist on your previous visit.Please clarify.

## 2021-10-05 ENCOUNTER — Telehealth: Payer: Medicaid Other | Admitting: Family Medicine

## 2021-10-05 DIAGNOSIS — H44002 Unspecified purulent endophthalmitis, left eye: Secondary | ICD-10-CM | POA: Diagnosis not present

## 2021-10-05 MED ORDER — POLYMYXIN B-TRIMETHOPRIM 10000-0.1 UNIT/ML-% OP SOLN: 1.0000 [drp] | OPHTHALMIC | 0 refills | Status: AC

## 2021-10-05 NOTE — Patient Instructions (Signed)
Bacterial Conjunctivitis, Adult °Bacterial conjunctivitis is an infection of the clear membrane that covers the white part of the eye and the inner surface of the eyelid (conjunctiva). When the blood vessels in the conjunctiva become inflamed, the eye becomes red or pink. The eye often feels irritated or itchy. Bacterial conjunctivitis spreads easily from person to person (is contagious). It also spreads easily from one eye to the other eye. °What are the causes? °This condition is caused by bacteria. You may get the infection if you come into close contact with: °A person who is infected with the bacteria. °Items that are contaminated with the bacteria, such as a face towel, contact lens solution, or eye makeup. °What increases the risk? °You are more likely to develop this condition if: °You are exposed to other people who have the infection. °You wear contact lenses. °You have a sinus infection. °You have had a recent eye injury or surgery. °You have a weak body defense system (immune system). °You have a medical condition that causes dry eyes. °What are the signs or symptoms? °Symptoms of this condition include: °Thick, yellowish discharge from the eye. This may turn into a crust on the eyelid overnight and cause your eyelids to stick together. °Tearing or watery eyes. °Itchy eyes. °Burning feeling in your eyes. °Eye redness. °Swollen eyelids. °Blurred vision. °How is this diagnosed? °This condition is diagnosed based on your symptoms and medical history. Your health care provider may also take a sample of discharge from your eye to find the cause of your infection. °How is this treated? °This condition may be treated with: °Antibiotic eye drops or ointment to clear the infection more quickly and prevent the spread of infection to others. °Antibiotic medicines taken by mouth (orally) to treat infections that do not respond to drops or ointments or that last longer than 10 days. °Cool, wet cloths (cool  compresses) placed on the eyes. °Artificial tears applied 2-6 times a day. °Follow these instructions at home: °Medicines °Take or apply your antibiotic medicine as told by your health care provider. Do not stop using the antibiotic, even if your condition improves, unless directed by your health care provider. °Take or apply over-the-counter and prescription medicines only as told by your health care provider. °Be very careful to avoid touching the edge of your eyelid with the eye-drop bottle or the ointment tube when you apply medicines to the affected eye. This will keep you from spreading the infection to your other eye or to other people. °Managing discomfort °Gently wipe away any drainage from your eye with a warm, wet washcloth or a cotton ball. °Apply a clean, cool compress to your eye for 10-20 minutes, 3-4 times a day. °General instructions °Do not wear contact lenses until the inflammation is gone and your health care provider says it is safe to wear them again. Ask your health care provider how to sterilize or replace your contact lenses before you use them again. Wear glasses until you can resume wearing contact lenses. °Avoid wearing eye makeup until the inflammation is gone. Throw away any old eye cosmetics that may be contaminated. °Change or wash your pillowcase every day. °Do not share towels or washcloths. This may spread the infection. °Wash your hands often with soap and water for at least 20 seconds and especially before touching your face or eyes. Use paper towels to dry your hands. °Avoid touching or rubbing your eyes. °Do not drive or use heavy machinery if your vision is blurred. °Contact   a health care provider if: °You have a fever. °Your symptoms do not get better after 10 days. °Get help right away if: °You have a fever and your symptoms suddenly get worse. °You have severe pain when you move your eye. °You have facial pain, redness, or swelling. °You have a sudden loss of  vision. °Summary °Bacterial conjunctivitis is an infection of the clear membrane that covers the white part of the eye and the inner surface of the eyelid (conjunctiva). °Bacterial conjunctivitis spreads easily from eye to eye and from person to person (is contagious). °Wash your hands often with soap and water for at least 20 seconds and especially before touching your face or eyes. Use paper towels to dry your hands. °Take or apply your antibiotic medicine as told by your health care provider. Do not stop using the antibiotic even if your condition improves. °Contact a health care provider if you have a fever or if your symptoms do not get better after 10 days. Get help right away if you have a sudden loss of vision. °This information is not intended to replace advice given to you by your health care provider. Make sure you discuss any questions you have with your health care provider. °Document Revised: 10/12/2020 Document Reviewed: 10/12/2020 °Elsevier Patient Education © 2022 [employer] ° °

## 2021-10-05 NOTE — Progress Notes (Signed)
?Virtual Visit Consent  ? ?[name], you are scheduled for a virtual visit with a [facility] provider today.   ?  ?Just as with appointments in the office, your consent must be obtained to participate.  Your consent will be active for this visit and any virtual visit you may have with one of our providers in the next 365 days.   ?  ?If you have a MyChart account, a copy of this consent can be sent to you electronically.  All virtual visits are billed to your insurance company just like a traditional visit in the office.   ? ?As this is a virtual visit, video technology does not allow for your provider to perform a traditional examination.  This may limit your provider's ability to fully assess your condition.  If your provider identifies any concerns that need to be evaluated in person or the need to arrange testing (such as labs, EKG, etc.), we will make arrangements to do so.   ?  ?Although advances in technology are sophisticated, we cannot ensure that it will always work on either your end or our end.  If the connection with a video visit is poor, the visit may have to be switched to a telephone visit.  With either a video or telephone visit, we are not always able to ensure that we have a secure connection.    ? ?I need to obtain your verbal consent now.   Are you willing to proceed with your visit today?  ?  ?Kamara Allan has provided verbal consent on 10/05/2021 for a virtual visit (video or telephone). ?  ?Freddy Finner, NP  ? ?Date: 10/05/2021 9:51 AM ? ? ?Virtual Visit via Video Note  ? ?Yvonne Black, connected with  Yvonne Black  ([id number], 1986-05-14) on 10/05/21 at  9:45 AM EDT by a video-enabled telemedicine application and verified that I am speaking with the correct person using two identifiers. ? ?Location: ?Patient: Virtual Visit Location Patient: Home ?Provider: Virtual Visit Location Provider: Home Office ?  ?I discussed the limitations of evaluation and management by telemedicine and the  availability of in person appointments. The patient expressed understanding and agreed to proceed.   ? ?History of Present Illness: ?Yvonne Black is a 36 y.o. who identifies as a female who was assigned female at birth, and is being seen today for questionable left eye infection. Woke up with it crusty with some yellow green like drainage. 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 They did sleep in her bed the other night- and that is when this happen. No pain, just a lot of drainage on going.  ? ?Problems:  ?Patient Active Problem List  ? Diagnosis Date Noted  ? Encounter to establish care 12/09/2020  ? Chronic midline thoracic back pain 12/09/2020  ? IUD (intrauterine device) in place 11/30/2019  ? History of cesarean section 10/09/2019  ? Urge incontinence 10/09/2019  ? History of anemia 10/09/2019  ? BMI 31.0-31.9,adult 10/09/2019  ? Hot flashes 10/09/2019  ? Weight gain 10/09/2019  ? Premature ovarian failure 08/29/2017  ?  ?Allergies:  ?Allergies  ?Allergen Reactions  ? Iodine Anaphylaxis  ? Latex Hives  ? Penicillins   ? Shellfish Allergy   ? ?Medications:  ?Current Outpatient Medications:  ?  omeprazole (PRILOSEC) 20 MG capsule, Take 1 capsule (20 mg total) by mouth daily., Disp: 30 capsule, Rfl: 3 ?  phentermine 37.5 MG capsule, Take 1 capsule (37.5 mg total)  by mouth every morning., Disp: 30 capsule, Rfl: 2 ? ?Observations/Objective: ?Patient is well-developed, well-nourished in no acute distress.  ?Resting comfortably  at home.  ?Head is normocephalic, atraumatic.  ?No labored breathing.  ?Speech is clear and coherent with logical content.  ?Patient is alert and oriented at baseline.  ?Left eye is drainage, crust some, mostly closed ?No swelling or redness noted during visit ? ?Assessment and Plan: ?1. Infection of left eye ?Eye movements intact no redness or swelling  ?Will treat for bacterial like infection  ? ?- trimethoprim-polymyxin b (POLYTRIM) ophthalmic solution; Place 1 drop  into the left eye every 4 (four) hours for 5 days.  Dispense: 10 mL; Refill: 0 ? ?Patient acknowledged agreement and understanding of the plan.  ? ? ? ?Follow Up Instructions: ?I discussed the assessment and treatment plan with the patient. The patient was provided an opportunity to ask questions and all were answered. The patient agreed with the plan and demonstrated an understanding of the instructions.  A copy of instructions were sent to the patient via MyChart unless otherwise noted below.  ? ? ? ?The patient was advised to call back or seek an in-person evaluation if the symptoms worsen or if the condition fails to improve as anticipated. ? ?Time:  ?I spent 10 minutes with the patient via telehealth technology discussing the above problems/concerns.   ? ?Freddy Finner, NP ? ?

## 2021-12-12 ENCOUNTER — Telehealth: Payer: Medicaid Other | Admitting: Physician Assistant

## 2021-12-12 DIAGNOSIS — J029 Acute pharyngitis, unspecified: Secondary | ICD-10-CM

## 2021-12-12 MED ORDER — IPRATROPIUM BROMIDE 0.03 % NA SOLN: 2.0000 | Freq: Two times a day (BID) | NASAL | 0 refills | Status: DC

## 2021-12-12 NOTE — Progress Notes (Signed)
E-Visit for Sore Throat   We are sorry that you are not feeling well.  Here is how we plan to help!   Your symptoms indicate a likely viral infection (Pharyngitis).   Pharyngitis is inflammation in the back of the throat which can cause a sore throat, scratchiness and sometimes difficulty swallowing.   Pharyngitis is typically caused by a respiratory virus and will just run its course.  Please keep in mind that your symptoms could last up to 10 days.  For throat pain, we recommend over the counter oral pain relief medications such as acetaminophen or aspirin, or anti-inflammatory medications such as ibuprofen or naproxen sodium.  Topical treatments such as oral throat lozenges or sprays may be used as needed.  Avoid close contact with loved ones, especially the very young and elderly.  Remember to wash your hands thoroughly throughout the day as this is the number one way to prevent the spread of infection and wipe down door knobs and counters with disinfectant.   After careful review of your answers, I would not recommend an antibiotic for your condition.  Antibiotics should not be used to treat conditions that we suspect are caused by viruses like the virus that causes the common cold or flu. However, some people can have Strep with atypical symptoms. You may need formal testing in clinic or office to confirm if your symptoms continue or worsen.   Providers prescribe antibiotics to treat infections caused by bacteria. Antibiotics are very powerful in treating bacterial infections when they are used properly.  To maintain their effectiveness, they should be used only when necessary.  Overuse of antibiotics has resulted in the development of super bugs that are resistant to treatment!     Home Care: Only take medications as instructed by your medical team. Do not drink alcohol while taking these medications. A steam or ultrasonic humidifier can help congestion.  You can place a towel over your head and  breathe in the steam from hot water coming from a faucet. Avoid close contacts especially the very young and the elderly. Cover your mouth when you cough or sneeze. Always remember to wash your hands.   Get Help Right Away If: You develop worsening fever or throat pain. You develop a severe head ache or visual changes. Your symptoms persist after you have completed your treatment plan.   Make sure you Understand these instructions. Will watch your condition. Will get help right away if you are not doing well or get worse.     Thank you for choosing an e-visit.   Your e-visit answers were reviewed by a board certified advanced clinical practitioner to complete your personal care plan. Depending upon the condition, your plan could have included both over the counter or prescription medications.   Please review your pharmacy choice. Make sure the pharmacy is open so you can pick up prescription now. If there is a problem, you may contact your provider through MyChart messaging and have the prescription routed to another pharmacy.  Your safety is important to us. If you have drug allergies check your prescription carefully.    For the next 24 hours you can use MyChart to ask questions about today's visit, request a non-urgent call back, or ask for a work or school excuse. You will get an email in the next two days asking about your experience. I hope that your e-visit has been valuable and will speed your recovery.  

## 2021-12-12 NOTE — Patient Instructions (Signed)
  [name], thank you for joining Piedad Climes, PA-C for today's virtual visit.  While this provider is not your primary care provider (PCP), if your PCP is located in our provider database this encounter information will be shared with them immediately following your visit.  Consent: (Patient) Yvonne Black provided verbal consent for this virtual visit at the beginning of the encounter.  Current Medications:  Current Outpatient Medications:    omeprazole (PRILOSEC) 20 MG capsule, Take 1 capsule (20 mg total) by mouth daily., Disp: 30 capsule, Rfl: 3   phentermine 37.5 MG capsule, Take 1 capsule (37.5 mg total) by mouth every morning., Disp: 30 capsule, Rfl: 2   Medications ordered in this encounter:  No orders of the defined types were placed in this encounter.    *If you need refills on other medications prior to your next appointment, please contact your pharmacy*  Follow-Up: Call back or seek an in-person evaluation if the symptoms worsen or if the condition fails to improve as anticipated.  Other Instructions Please keep well-hydrated and get plenty of rest. Alternate tylenol and ibuprofen as needed for throat pain. Start a saline nasal rinse daily.  Use the prescription nasal spray as directed.  Restart Zyrtec.  Follow-up if not easing up within next few days and continuing to improve until fully resolved.    If you have been instructed to have an in-person evaluation today at a local Urgent Care facility, please use the link below. It will take you to a list of all of our available [facility], including address, phone number and hours of operation. Please do not delay care.  [facility]  If you or a family member do not have a primary care provider, use the link below to schedule a visit and establish care. When you choose a [facility] primary care physician or advanced practice provider, you gain a long-term partner in health. Find a  Primary Care Provider  Learn more about [facility]'s in-office and virtual care options: [facility] - Get Care Now

## 2021-12-12 NOTE — Progress Notes (Signed)
Virtual Visit Consent   Yvonne Black, you are scheduled for a virtual visit with a [facility] provider today. Just as with appointments in the office, your consent must be obtained to participate. Your consent will be active for this visit and any virtual visit you may have with one of our providers in the next 365 days. If you have a MyChart account, a copy of this consent can be sent to you electronically.  As this is a virtual visit, video technology does not allow for your provider to perform a traditional examination. This may limit your provider's ability to fully assess your condition. If your provider identifies any concerns that need to be evaluated in person or the need to arrange testing (such as labs, EKG, etc.), we will make arrangements to do so. Although advances in technology are sophisticated, we cannot ensure that it will always work on either your end or our end. If the connection with a video visit is poor, the visit may have to be switched to a telephone visit. With either a video or telephone visit, we are not always able to ensure that we have a secure connection.  By engaging in this virtual visit, you consent to the provision of healthcare and authorize for your insurance to be billed (if applicable) for the services provided during this visit. Depending on your insurance coverage, you may receive a charge related to this service.  I need to obtain your verbal consent now. Are you willing to proceed with your visit today? Yvonne Black has provided verbal consent on 12/12/2021 for a virtual visit (video or telephone). Yvonne Black, [place]  Date: 12/12/2021 7:59 AM  Virtual Visit via Video Note   I, Yvonne Black, connected with  Yvonne Black  ([id number], 04/15/1986) on 12/12/21 at  7:45 AM EDT by a video-enabled telemedicine application and verified that I am speaking with the correct person using two identifiers.  Location: Patient: Virtual Visit Location Patient:  Home Provider: Virtual Visit Location Provider: Home Office   I discussed the limitations of evaluation and management by telemedicine and the availability of in person appointments. The patient expressed understanding and agreed to proceed.    History of Present Illness: Yvonne Black is a 36 y.o. who identifies as a female who was assigned female at birth, and is being seen today for sore throat. Was just evaluated by e-visit earlier this morning and diagnosed with a viral pharyngitis by this provider.  Patient endorses sore throat x 2-3 days, worse today compared to yesterday. Has tried to keep well-hydrated with warm liquids. Has noted some runny nose and nasal drainage but things due to allergies from being outside. Denies fever, chills. Some cough without chest congestion.   Has not taken anything for her symptoms. Denies recent travel or sick contact.   HPI: HPI  Problems:  Patient Active Problem List   Diagnosis Date Noted   Encounter to establish care 12/09/2020   Chronic midline thoracic back pain 12/09/2020   IUD (intrauterine device) in place 11/30/2019   History of cesarean section 10/09/2019   Urge incontinence 10/09/2019   History of anemia 10/09/2019   BMI 31.0-31.9,adult 10/09/2019   Hot flashes 10/09/2019   Weight gain 10/09/2019   Premature ovarian failure 08/29/2017    Allergies:  Allergies  Allergen Reactions   Iodine Anaphylaxis   Latex Hives   Penicillins    Shellfish Allergy    Medications:  Current Outpatient Medications:    ipratropium (ATROVENT) 0.03 %  nasal spray, Place 2 sprays into both nostrils every 12 (twelve) hours., Disp: 30 mL, Rfl: 0   omeprazole (PRILOSEC) 20 MG capsule, Take 1 capsule (20 mg total) by mouth daily., Disp: 30 capsule, Rfl: 3   phentermine 37.5 MG capsule, Take 1 capsule (37.5 mg total) by mouth every morning., Disp: 30 capsule, Rfl: 2 Observations/Objective: Patient is well-developed, well-nourished in no acute distress.   Resting comfortably at home.  Head is normocephalic, atraumatic.  No labored breathing. Speech is clear and coherent with logical content.  Patient is alert and oriented at baseline.  Mild erythema of posterior oropharynx without edema. Uvula midline and without lesion. No tonsillar swelling or exudate.   Assessment and Plan: 1. Acute viral pharyngitis - ipratropium (ATROVENT) 0.03 % nasal spray; Place 2 sprays into both nostrils every 12 (twelve) hours.  Dispense: 30 mL; Refill: 0  History and exam consistent with a viral etiology. PND contributing to throat pain. Supportive measures and OTC medications reviewed. Start zyrtec as well. Rx Ipratropium nasal spray to use BID. Follow-up if not easing up over next few days.   Follow Up Instructions: I discussed the assessment and treatment plan with the patient. The patient was provided an opportunity to ask questions and all were answered. The patient agreed with the plan and demonstrated an understanding of the instructions.  A copy of instructions were sent to the patient via MyChart unless otherwise noted below.   The patient was advised to call back or seek an in-person evaluation if the symptoms worsen or if the condition fails to improve as anticipated.  Time:  I spent 10 minutes with the patient via telehealth technology discussing the above problems/concerns.    Yvonne Climes, PA-C

## 2021-12-12 NOTE — Progress Notes (Signed)
I have spent 5 minutes in review of e-visit questionnaire, review and updating patient chart, medical decision making and response to patient.   Shanice Poznanski Cody Kanai Hilger, PA-C    

## 2021-12-13 ENCOUNTER — Telehealth: Payer: Medicaid Other | Admitting: Physician Assistant

## 2021-12-13 ENCOUNTER — Telehealth: Payer: Medicaid Other

## 2021-12-13 DIAGNOSIS — J029 Acute pharyngitis, unspecified: Secondary | ICD-10-CM | POA: Diagnosis not present

## 2021-12-13 MED ORDER — LIDOCAINE VISCOUS HCL 2 % MT SOLN: OROMUCOSAL | 0 refills | Status: DC

## 2021-12-13 MED ORDER — BENZONATATE 100 MG PO CAPS: 100.0000 mg | ORAL_CAPSULE | Freq: Three times a day (TID) | ORAL | 0 refills | Status: DC | PRN

## 2021-12-13 MED ORDER — NAPROXEN 500 MG PO TABS: 500.0000 mg | ORAL_TABLET | Freq: Two times a day (BID) | ORAL | 0 refills | Status: DC

## 2021-12-13 NOTE — Progress Notes (Signed)
E-Visit for Corona Virus Screening  Your current symptoms could be consistent with the coronavirus.  Many health care providers can now test patients at their office but not all are.  [facility] has multiple testing sites. For information on our COVID testing locations and hours go to [email or web address]  We are enrolling you in our MyChart Home Monitoring for COVID19 . Daily you will receive a questionnaire within the MyChart website. Our COVID 19 response team will be monitoring your responses daily.  Testing Information: The COVID-19 Community Testing sites are testing BY APPOINTMENT ONLY.  You can schedule online at [email or web address]  If you do not have access to a smart phone or computer you may call [phone] for an appointment.   Additional testing sites in the Community:  For [facility] Testing sites in [place]  [email or web address]  For Pop-up testing sites in [place]  [email or web address]  For [email or web address]  For [facility] testing in [place] and [employer] [email or web address]  For [facility] testing in [place]   [email or web address]  For  more information about community testing call [phone]   Please quarantine yourself while awaiting your test results. Please stay home for a minimum of 10 days from the first day of illness with improving symptoms and you have had 24 hours of no fever (without the use of Tylenol (Acetaminophen) Motrin (Ibuprofen) or any fever reducing medication).  Also - Do not get tested prior to returning to work because once you have had a positive test the test can stay positive for  more than a month in some cases.   You should wear a mask or cloth face covering over your nose and mouth if you must be around other people or animals, including pets (even at home). Try to stay at least 6 feet away from other people. This will protect the people around you.  Please continue good preventive care measures, including:  frequent hand-washing, avoid touching your face, cover coughs/sneezes, stay out of crowds and keep a 6 foot distance from others.  COVID-19 is a respiratory illness with symptoms that are similar to the flu. Symptoms are typically mild to moderate, but there have been cases of severe illness and death due to the virus.   The following symptoms may appear 2-14 days after exposure: Fever Cough Shortness of breath or difficulty breathing Chills Repeated shaking with chills Muscle pain Headache Sore throat New loss of taste or smell Fatigue Congestion or runny nose Nausea or vomiting Diarrhea  Go to the nearest hospital ED for assessment if fever/cough/breathlessness are severe or illness seems like a threat to life.  It is vitally important that if you feel that you have an infection such as this virus or any other virus that you stay home and away from places where you may spread it to others.  You should avoid contact with people age 75 and older.   You can use medication such as prescription cough medication called Tessalon Perles 100 mg. You may take 1-2 capsules every 8 hours as needed for cough and prescription anti-inflammatory called Naprosyn 500 mg. Take twice daily as needed for fever or body aches for 2 weeks.  I have also prescribed Viscous lidocaine. Swallow 47mL every 4-6 hours as needed for throat pain.   You may also take acetaminophen (Tylenol) as needed for fever.  Reduce your risk of any infection by using the same precautions used for avoiding the common cold or flu:  Wash your hands  often with soap and warm water for at least 20 seconds.  If  soap and water are not readily available, use an alcohol-based hand sanitizer with at least 60% alcohol.  If coughing or sneezing, cover your mouth and nose by coughing or sneezing into the elbow areas of your shirt or coat, into a tissue or into your sleeve (not your hands). Avoid shaking hands with others and consider head nods or verbal greetings only. Avoid touching your eyes, nose, or mouth with unwashed hands.  Avoid close contact with people who are sick. Avoid places or events with large numbers of people in one location, like concerts or sporting events. Carefully consider travel plans you have or are making. If you are planning any travel outside or inside the [place], visit the CDC's Travelers' Health webpage for the latest health notices. If you have some symptoms but not all symptoms, continue to monitor at home and seek medical attention if your symptoms worsen. If you are having a medical emergency, call 911.  HOME CARE Only take medications as instructed by your medical team. Drink plenty of fluids and get plenty of rest. A steam or ultrasonic humidifier can help if you have congestion.   GET HELP RIGHT AWAY IF YOU HAVE EMERGENCY WARNING SIGNS** FOR COVID-19. If you or someone is showing any of these signs seek emergency medical care immediately. Call 911 or proceed to your closest emergency facility if: You develop worsening high fever. Trouble breathing Bluish lips or face Persistent pain or pressure in the chest New confusion Inability to wake or stay awake You cough up blood. Your symptoms become more severe  **This list is not all possible symptoms. Contact your medical provider for any symptoms that are sever or concerning to you.  MAKE SURE YOU  Understand these instructions. Will watch your condition. Will get help right away if you are not doing well or get worse.  Your e-visit answers were reviewed by a board certified advanced clinical practitioner to complete  your personal care plan.  Depending on the condition, your plan could have included both over the counter or prescription medications.  If there is a problem please reply once you have received a response from your provider.  Your safety is important to [place].  If you have drug allergies check your prescription carefully.    You can use MyChart to ask questions about today's visit, request a non-urgent call back, or ask for a work or school excuse for 24 hours related to this e-Visit. If it has been greater than 24 hours you will need to follow up with your provider, or enter a new e-Visit to address those concerns. You will get an e-mail in the next two days asking about your experience.  I hope that your e-visit has been valuable and will speed your recovery. Thank you for using e-visits.  I provided 5 minutes of non face-to-face time during this encounter for chart review and documentation.

## 2022-01-30 ENCOUNTER — Telehealth: Payer: Medicaid Other | Admitting: Physician Assistant

## 2022-01-30 DIAGNOSIS — R0981 Nasal congestion: Secondary | ICD-10-CM

## 2022-01-30 DIAGNOSIS — R0683 Snoring: Secondary | ICD-10-CM

## 2022-01-30 NOTE — Progress Notes (Signed)
Because it seems symptoms are ongoing and giving the snoring you need an examination of the nose and evaluation for risks of sleep apnea/formal testing, I feel your condition warrants further evaluation and I recommend that you be seen for a face to face visit.  Please contact your primary care physician practice to be seen. Many offices offer virtual options to be seen via video if you are not comfortable going in person to a medical facility at this time.  NOTE: You will NOT be charged for this eVisit.  If you do not have a PCP, [facility] offers a free physician referral service available at [phone]. Our trained staff has the experience, knowledge and resources to put you in touch with a physician who is right for you.    If you are having a true medical emergency please call 911.   Your e-visit answers were reviewed by a board certified advanced clinical practitioner to complete your personal care plan.  Thank you for using e-Visits.

## 2022-02-02 ENCOUNTER — Telehealth (INDEPENDENT_AMBULATORY_CARE_PROVIDER_SITE_OTHER): Payer: Medicaid Other | Admitting: Family

## 2022-02-02 ENCOUNTER — Encounter: Payer: Self-pay | Admitting: Family

## 2022-02-02 ENCOUNTER — Telehealth: Payer: Self-pay

## 2022-02-02 DIAGNOSIS — E669 Obesity, unspecified: Secondary | ICD-10-CM

## 2022-02-02 DIAGNOSIS — R0683 Snoring: Secondary | ICD-10-CM | POA: Insufficient documentation

## 2022-02-02 NOTE — Telephone Encounter (Signed)
I connected with  Yvonne Black on 02/02/22 by a video enabled telemedicine application and verified that I am speaking with the correct person using two identifiers.   I discussed the limitations of evaluation and management by telemedicine. The patient expressed understanding and agreed to proceed.

## 2022-02-02 NOTE — Progress Notes (Signed)
This service is provided via telemedicine  No vital signs collected/recorded due to the encounter was a telemedicine visit.   Location of patient (ex: home, work):  home  Patient consents to a telephone visit:  yes see consent 02/02/2022  Location of the provider (ex: office, home):  [facility]  Name of any referring provider:  N/A  Names of all persons participating in the telemedicine service and their role in the encounter:  patient, Guss Bunde CCMA,CMAA,Sharrie Self C, NP   Time spent on call:  5 min    Provider: Axcel Horsch FNP-C  Yvonne Black, Donalee Citrin, NP  Patient Care Team: Nikki Glanzer, Donalee Citrin, NP as PCP - General (Family Medicine)  No emergency contact information on file.  Code Status:  Full Code  Goals of care: Advanced Directive information    07/28/2021    1:29 PM  Advanced Directives  Does Patient Have a Medical Advance Directive? No  Would patient like information on creating a medical advance directive? No - Patient declined     Chief Complaint  Patient presents with   Acute Visit    Patient is here to discuss sleep apnea results    HPI:  Pt is a 36 y.o. female seen today for an acute visit to discuss referral for sleep apnea.states has been told by family members that she snores a lot.she would like an order for sleep study at home since she has little kids she cannot leave them for an inpatient sleep study. Has a significant medical history of Obesity latest BMI 34.35    Past Medical History:  Diagnosis Date   Hormone disorder    Past Surgical History:  Procedure Laterality Date   BREAST SURGERY  12/19/2016   Reduction   CESAREAN SECTION  05/03/2010   CYST REMOVAL HAND     tummy tuck  12/19/2016    Allergies  Allergen Reactions   Iodine Anaphylaxis   Latex Hives   Penicillins    Shellfish Allergy     Outpatient Encounter Medications as of 02/02/2022  Medication Sig   benzonatate (TESSALON) 100 MG  capsule Take 1 capsule (100 mg total) by mouth 3 (three) times daily as needed.   ipratropium (ATROVENT) 0.03 % nasal spray Place 2 sprays into both nostrils every 12 (twelve) hours.   lidocaine (XYLOCAINE) 2 % solution Swallow 35mL every 4-6 hours as needed for sore throat   naproxen (NAPROSYN) 500 MG tablet Take 1 tablet (500 mg total) by mouth 2 (two) times daily with a meal.   omeprazole (PRILOSEC) 20 MG capsule Take 1 capsule (20 mg total) by mouth daily.   phentermine 37.5 MG capsule Take 1 capsule (37.5 mg total) by mouth every morning.   No facility-administered encounter medications on file as of 02/02/2022.    Review of Systems  Constitutional:  Negative for appetite change, chills, fatigue, fever and unexpected weight change.  HENT:  Negative for congestion, hearing loss, nosebleeds, postnasal drip, rhinorrhea, sinus pressure, sinus pain, sneezing, sore throat, tinnitus and trouble swallowing.        Snores  Eyes:  Negative for pain, discharge, redness, itching and visual disturbance.  Respiratory:  Negative for cough, chest tightness, shortness of breath and wheezing.   Cardiovascular:  Negative for chest pain, palpitations and leg swelling.  Neurological:  Negative for dizziness, syncope, speech difficulty, weakness, light-headedness, numbness and headaches.  Psychiatric/Behavioral:  Positive for sleep disturbance. Negative for agitation. The patient is not nervous/anxious.  Immunization History  Administered Date(s) Administered   Influenza-Unspecified 03/30/2010   PFIZER(Purple Top)SARS-COV-2 Vaccination 02/14/2020, 03/07/2020   Tdap 05/04/2010, 10/09/2019   Pertinent  Health Maintenance Due  Topic Date Due   PAP SMEAR-Modifier  08/15/2024   INFLUENZA VACCINE  Discontinued      04/08/2018    8:13 AM 06/22/2021    1:03 PM 07/28/2021    1:29 PM  Fall Risk  Falls in the past year?   0  Was there an injury with Fall?   0  Fall Risk Category Calculator   0  Fall Risk  Category   Low  Patient Fall Risk Level Low fall risk Low fall risk Low fall risk  Patient at Risk for Falls Due to   No Fall Risks  Fall risk Follow up   Falls evaluation completed   Functional Status Survey:    There were no vitals filed for this visit. There is no height or weight on file to calculate BMI.  Physical Exam Unable to complete on telephone visit.  Labs reviewed: Recent Labs    06/22/21 1304 07/28/21 1405  NA 137 138  K 3.8 3.9  CL 106 103  CO2 26 26  GLUCOSE 109* 83  BUN 8 10  CREATININE 0.69 0.88  CALCIUM 9.4 9.8   Recent Labs    07/28/21 1405  AST 28  ALT 27  BILITOT 0.5  PROT 7.8   Recent Labs    06/22/21 1304 07/28/21 1405  WBC 4.1 4.8  NEUTROABS  --  2,669  HGB 11.8* 12.4  HCT 36.5 37.7  MCV 88.8 87.9  PLT 224 261   Lab Results  Component Value Date   TSH 1.13 07/28/2021   No results found for: "HGBA1C" Lab Results  Component Value Date   CHOL 226 (H) 07/28/2021   HDL 62 07/28/2021   LDLCALC 149 (H) 07/28/2021   TRIG 57 07/28/2021   CHOLHDL 3.6 07/28/2021    Significant Diagnostic Results in last 30 days:  No results found.  Assessment/Plan 1. Snoring Family reported to her that she snores. Request in home sleep study with [facility].will place referral made aware company will contact her for in home sleep study. - Ambulatory referral to Sleep Studies  2. Obesity (BMI 30.0-34.9) Contributory factor with  - Ambulatory referral to Sleep Studies   Family/ staff Communication: Reviewed plan of care with patient verbalized understanding   Labs/tests ordered: None   Next Appointment: As needed if symptoms worsen or fail to improve   I connected with  Yvonne Black on 02/02/22 by a telephone enabled telemedicine application and verified that I am speaking with the correct person using two identifiers.   I discussed the limitations of evaluation and management by telemedicine. The patient expressed understanding and  agreed to proceed.  Spent 11 minutes of non-face to face with patient  >50% time spent counseling; reviewing medical record and developing future plan of care.    Caesar Bookman, NP

## 2022-03-01 ENCOUNTER — Telehealth: Payer: Medicaid Other | Admitting: Physician Assistant

## 2022-03-01 DIAGNOSIS — R300 Dysuria: Secondary | ICD-10-CM

## 2022-03-01 MED ORDER — FLUCONAZOLE 150 MG PO TABS: ORAL_TABLET | ORAL | 0 refills | Status: DC

## 2022-03-01 MED ORDER — CEPHALEXIN 500 MG PO CAPS: 500.0000 mg | ORAL_CAPSULE | Freq: Two times a day (BID) | ORAL | 0 refills | Status: AC

## 2022-03-01 NOTE — Progress Notes (Signed)

## 2022-03-14 ENCOUNTER — Telehealth: Payer: Medicaid Other | Admitting: Family Medicine

## 2022-03-14 ENCOUNTER — Telehealth: Payer: Medicaid Other

## 2022-03-14 DIAGNOSIS — H44003 Unspecified purulent endophthalmitis, bilateral: Secondary | ICD-10-CM

## 2022-03-14 MED ORDER — POLYMYXIN B-TRIMETHOPRIM 10000-0.1 UNIT/ML-% OP SOLN: 1.0000 [drp] | Freq: Four times a day (QID) | OPHTHALMIC | 0 refills | Status: DC

## 2022-03-14 NOTE — Patient Instructions (Addendum)
  [name], thank you for joining Freddy Finner, NP for today's virtual visit.  While this provider is not your primary care provider (PCP), if your PCP is located in our provider database this encounter information will be shared with them immediately following your visit.  Consent: (Patient) Yvonne Black provided verbal consent for this virtual visit at the beginning of the encounter.  Current Medications:  Current Outpatient Medications:    trimethoprim-polymyxin b (POLYTRIM) ophthalmic solution, Place 1 drop into both eyes in the morning, at noon, in the evening, and at bedtime. 5 days, Disp: 10 mL, Rfl: 0   benzonatate (TESSALON) 100 MG capsule, Take 1 capsule (100 mg total) by mouth 3 (three) times daily as needed., Disp: 30 capsule, Rfl: 0   fluconazole (DIFLUCAN) 150 MG tablet, Take one tablet on the day you fill the prescription. If you continue to have symptoms then take the second tablet in 3 days., Disp: 2 tablet, Rfl: 0   ipratropium (ATROVENT) 0.03 % nasal spray, Place 2 sprays into both nostrils every 12 (twelve) hours., Disp: 30 mL, Rfl: 0   lidocaine (XYLOCAINE) 2 % solution, Swallow 69mL every 4-6 hours as needed for sore throat, Disp: 100 mL, Rfl: 0   naproxen (NAPROSYN) 500 MG tablet, Take 1 tablet (500 mg total) by mouth 2 (two) times daily with a meal., Disp: 30 tablet, Rfl: 0   omeprazole (PRILOSEC) 20 MG capsule, Take 1 capsule (20 mg total) by mouth daily., Disp: 30 capsule, Rfl: 3   phentermine 37.5 MG capsule, Take 1 capsule (37.5 mg total) by mouth every morning., Disp: 30 capsule, Rfl: 2   Medications ordered in this encounter:  Meds ordered this encounter  Medications   trimethoprim-polymyxin b (POLYTRIM) ophthalmic solution    Sig: Place 1 drop into both eyes in the morning, at noon, in the evening, and at bedtime. 5 days    Dispense:  10 mL    Refill:  0    Order Specific Question:   Supervising Provider    Answer:   Hyacinth Meeker, BRIAN [[id number]]     *If you need  refills on other medications prior to your next appointment, please contact your pharmacy*  Follow-Up: Call back or seek an in-person evaluation if the symptoms worsen or if the condition fails to improve as anticipated.   If you have been instructed to have an in-person evaluation today at a local Urgent Care facility, please use the link below. It will take you to a list of all of our available [facility], including address, phone number and hours of operation. Please do not delay care.  [facility]  If you or a family member do not have a primary care provider, use the link below to schedule a visit and establish care. When you choose a [facility] primary care physician or advanced practice provider, you gain a long-term partner in health. Find a Primary Care Provider  Learn more about [facility]'s in-office and virtual care options: [facility] - Get Care Now

## 2022-03-14 NOTE — Progress Notes (Signed)
Virtual Visit Consent   Kimberla Driskill, you are scheduled for a virtual visit with a [facility] provider today. Just as with appointments in the office, your consent must be obtained to participate. Your consent will be active for this visit and any virtual visit you may have with one of our providers in the next 365 days. If you have a MyChart account, a copy of this consent can be sent to you electronically.  As this is a virtual visit, video technology does not allow for your provider to perform a traditional examination. This may limit your provider's ability to fully assess your condition. If your provider identifies any concerns that need to be evaluated in person or the need to arrange testing (such as labs, EKG, etc.), we will make arrangements to do so. Although advances in technology are sophisticated, we cannot ensure that it will always work on either your end or our end. If the connection with a video visit is poor, the visit may have to be switched to a telephone visit. With either a video or telephone visit, we are not always able to ensure that we have a secure connection.  By engaging in this virtual visit, you consent to the provision of healthcare and authorize for your insurance to be billed (if applicable) for the services provided during this visit. Depending on your insurance coverage, you may receive a charge related to this service.  I need to obtain your verbal consent now. Are you willing to proceed with your visit today? Jovita Persing has provided verbal consent on 03/14/2022 for a virtual visit (video or telephone). Freddy Finner, NP  Date: 03/14/2022 10:33 AM  Virtual Visit via Video Note   I, Freddy Finner, connected with  Clella Mckeel  ([id number], 04/07/1986) on 03/14/22 at 10:30 AM EDT by a video-enabled telemedicine application and verified that I am speaking with the correct person using two identifiers.  Location: Patient: Virtual Visit Location Patient:  Home Provider: Virtual Visit Location Provider: Home Office   I discussed the limitations of evaluation and management by telemedicine and the availability of in person appointments. The patient expressed understanding and agreed to proceed.    History of Present Illness: Yvonne Black is a 36 y.o. who identifies as a female who was assigned female at birth, and is being seen today for eye irritation. Start with Right eye 2 days ago with yellow drainage. Left eye started this morning. Did get make up in eye that caused some discomfort. Has allergies that can cause eye trouble as well. Has not had an issue in a while. Denies URI, exposure to pink eye, or fevers or chills.    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 If you continue to have symptoms then take the second tablet in 3 days., Disp: 2 tablet, Rfl: 0   ipratropium (ATROVENT) 0.03 % nasal spray, Place 2 sprays into both nostrils every 12 (twelve) hours., Disp: 30 mL, Rfl: 0   lidocaine (XYLOCAINE) 2 % solution, Swallow 49mL every 4-6 hours as needed for sore throat, Disp: 100 mL, Rfl: 0   naproxen (NAPROSYN) 500 MG tablet, Take 1 tablet  (500 mg total) by mouth 2 (two) times daily with a meal., Disp: 30 tablet, Rfl: 0   omeprazole (PRILOSEC) 20 MG capsule, Take 1 capsule (20 mg total) by mouth daily., Disp: 30 capsule, Rfl: 3   phentermine 37.5 MG capsule, Take 1 capsule (37.5 mg total) by mouth every morning., Disp: 30 capsule, Rfl: 2  Observations/Objective: Patient is well-developed, well-nourished in no acute distress.  Resting comfortably  at home.  Head is normocephalic, atraumatic.  No labored breathing.  Speech is clear and coherent with logical content.  Patient is alert and oriented at baseline.  No noted redness in either eye, mild swelling of right upper lid. No drainage is visible on video  Assessment and Plan: 1. Infection of both eyes Given symptoms and history will treat for infection  - trimethoprim-polymyxin b (POLYTRIM) ophthalmic solution; Place 1 drop into both eyes in the morning, at noon, in the evening, and at bedtime. 5 days  Dispense: 10 mL; Refill: 0   Reviewed side effects, risks and benefits of medication.    Patient acknowledged agreement and understanding of the plan.    Past Medical, Surgical, Social History, Allergies, and Medications have been Reviewed.    Follow Up Instructions: I discussed the assessment and treatment plan with the patient. The patient was provided an opportunity to ask questions and all were answered. The patient agreed with the plan and demonstrated an understanding of the instructions.  A copy of instructions were sent to the patient via MyChart unless otherwise noted below.     The patient was advised to call back or seek an in-person evaluation if the symptoms worsen or if the condition fails to improve as anticipated.  Time:  I spent 10 minutes with the patient via telehealth technology discussing the above problems/concerns.    Freddy Finner, NP

## 2022-03-28 IMAGING — CR DG CHEST 2V
1 series · 2 of 2 positions shown · non-contrast
Comparison: None.

CLINICAL DATA: Patient complains of chest pain, dizziness,
headache, and neck pain. Pt states this has been ongoing for a
couple of months.

EXAM:
CHEST - 2 VIEW

[Series 1: dg chest 2 view · 0.14mm/px · 2 of 2 slices shown]
[im 1/2]
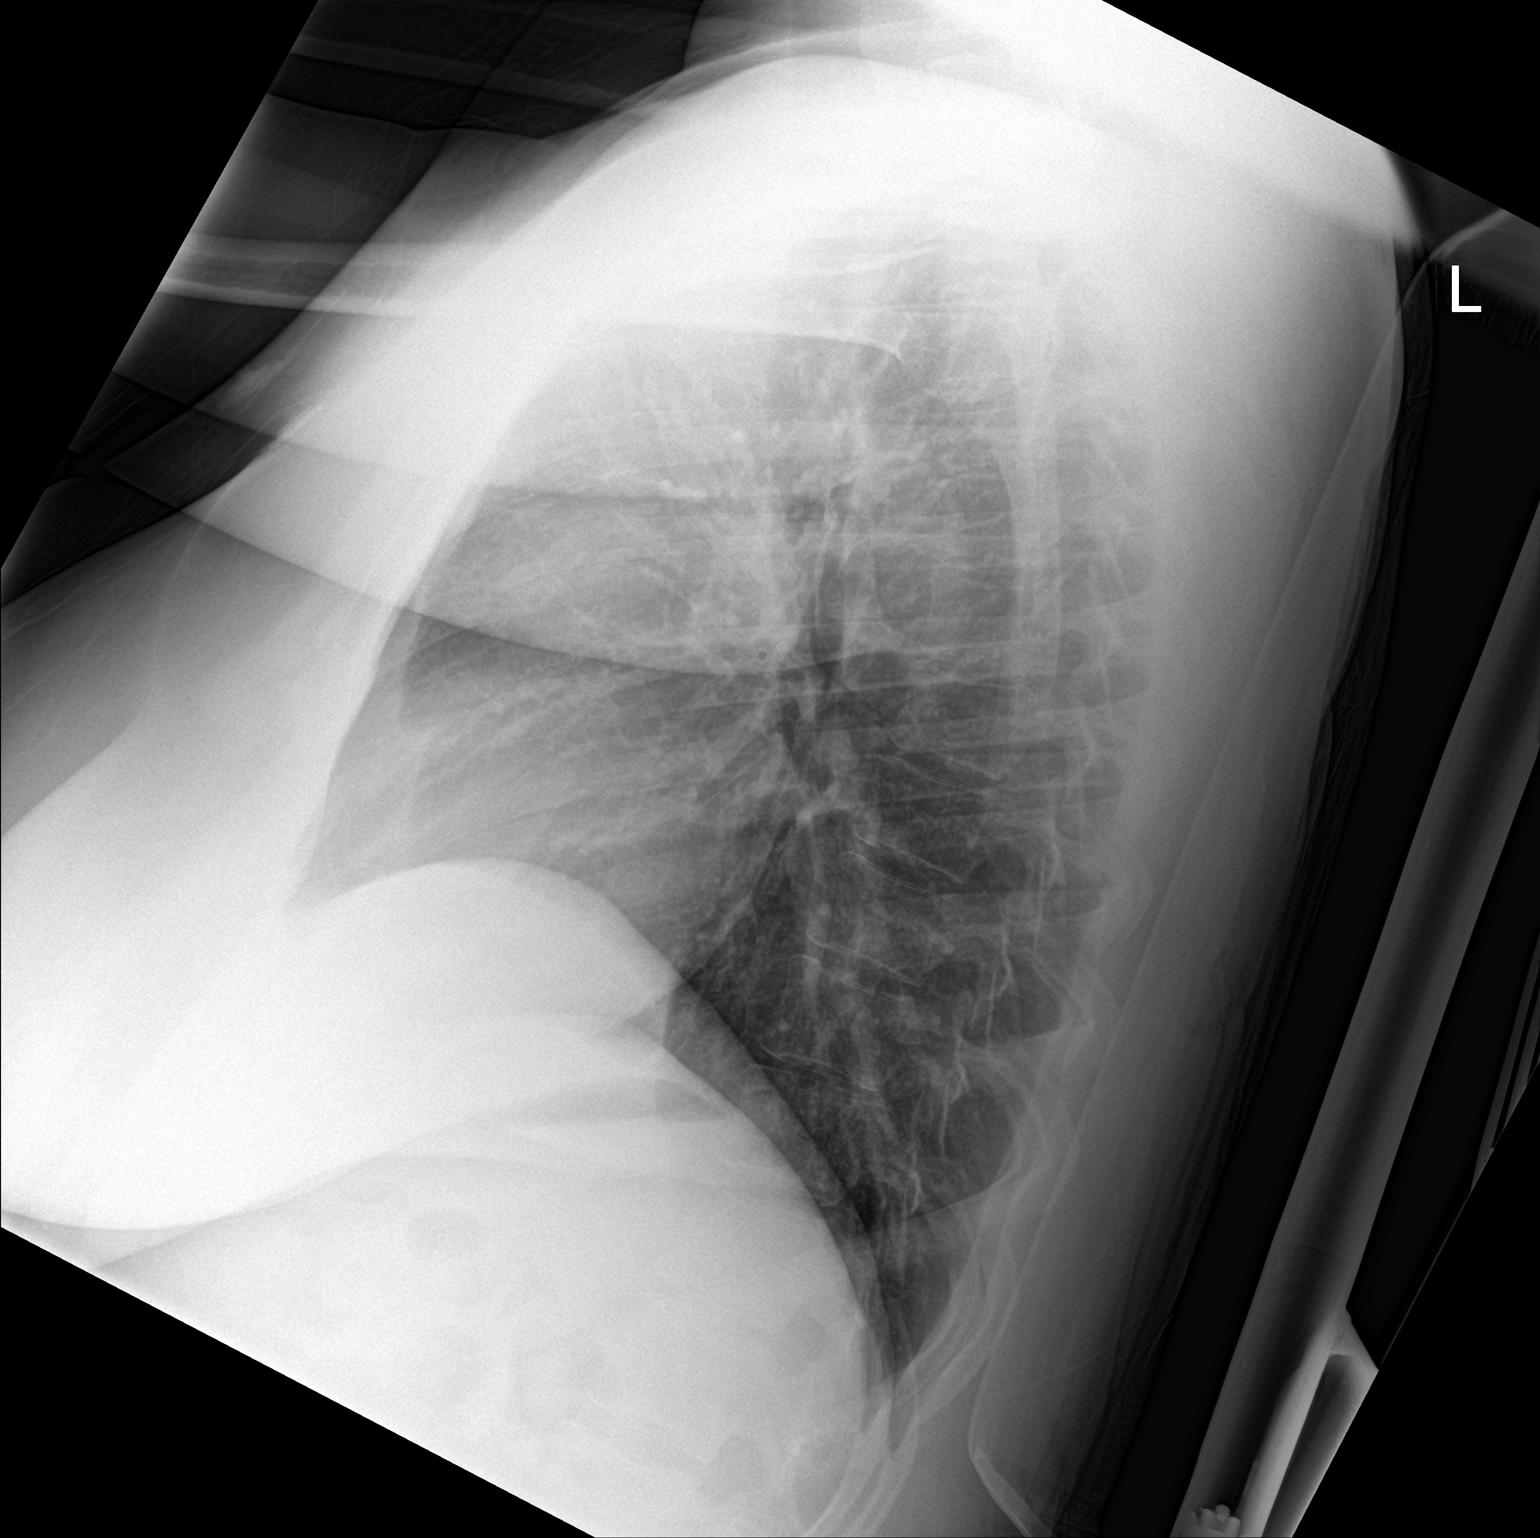
[im 2/2]
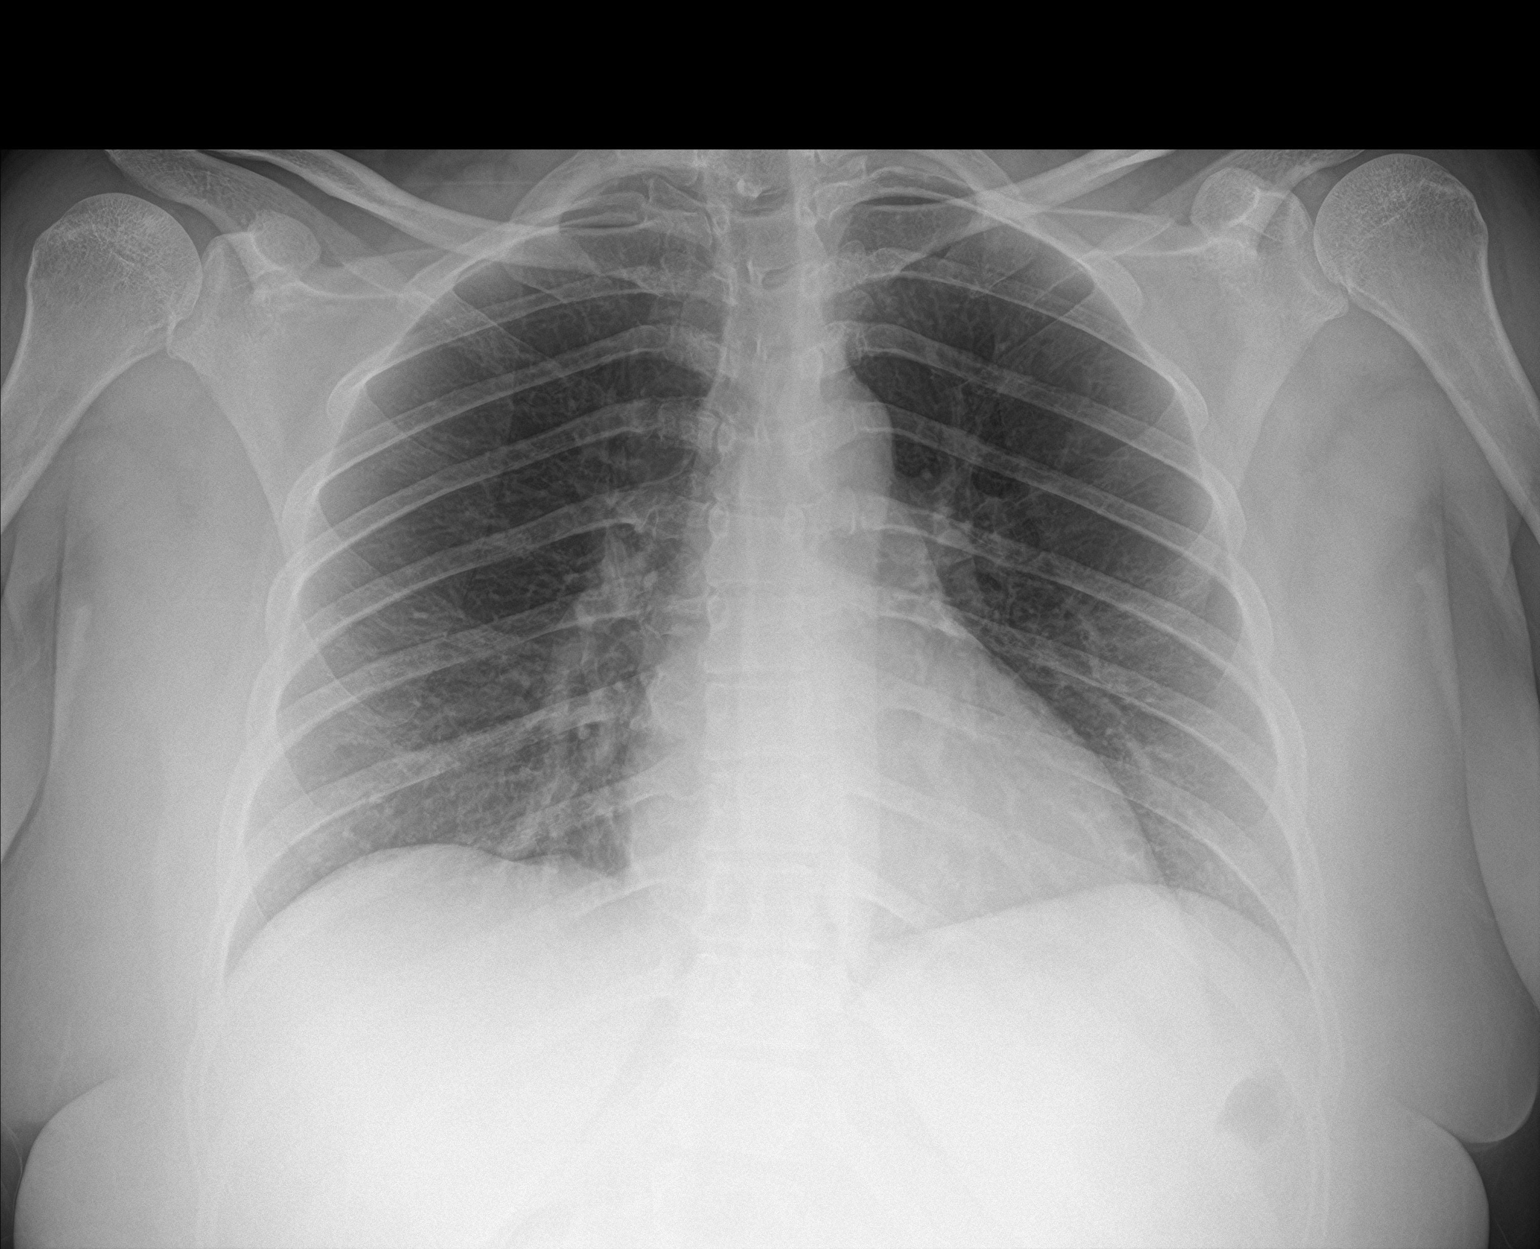

[2 of 2 positions shown; findings below may reference images not displayed]

FINDINGS: The heart size and mediastinal contours are within normal limits.
Both lungs are clear. The visualized skeletal structures are
unremarkable.
IMPRESSION: No active cardiopulmonary disease.

## 2022-04-17 ENCOUNTER — Telehealth: Payer: Medicaid Other | Admitting: Physician Assistant

## 2022-04-17 DIAGNOSIS — J069 Acute upper respiratory infection, unspecified: Secondary | ICD-10-CM

## 2022-04-17 DIAGNOSIS — Z20822 Contact with and (suspected) exposure to covid-19: Secondary | ICD-10-CM

## 2022-04-17 MED ORDER — BENZONATATE 100 MG PO CAPS: 100.0000 mg | ORAL_CAPSULE | Freq: Three times a day (TID) | ORAL | 0 refills | Status: DC | PRN

## 2022-04-17 NOTE — Progress Notes (Signed)
E-Visit for Corona Virus Screening  We recommend you test for COVID giving symptoms seem mostly consistent with this versus a flu-like illness. If positive we can discuss additional treatments. If negative, we can consider treating like the flu.   Please quarantine yourself while awaiting your test results.   You should wear a mask or cloth face covering over your nose and mouth if you must be around other people or animals, including pets (even at home). Try to stay at least 6 feet away from other people. This will protect the people around you.  Please continue good preventive care measures, including:  frequent hand-washing, avoid touching your face, cover coughs/sneezes, stay out of crowds and keep a 6 foot distance from others.  COVID-19 is a respiratory illness with symptoms that are similar to the flu. Symptoms are typically mild to moderate, but there have been cases of severe illness and death due to the virus.   The following symptoms may appear 2-14 days after exposure: Fever Cough Shortness of breath or difficulty breathing Chills Repeated shaking with chills Muscle pain Headache Sore throat New loss of taste or smell Fatigue Congestion or runny nose Nausea or vomiting Diarrhea  Go to the nearest hospital ED for assessment if fever/cough/breathlessness are severe or illness seems like a threat to life.  It is vitally important that if you feel that you have an infection such as this virus or any other virus that you stay home and away from places where you may spread it to others.  You should avoid contact with people age 43 and older.   You can use medication such as prescription cough medication called Tessalon Perles 100 mg. You may take 1-2 capsules every 8 hours as needed for cough. This has been sent to your pharmacy.  You may also take acetaminophen (Tylenol) as needed for fever.  Reduce your risk of any infection by using the same precautions used for avoiding the  common cold or flu:  Wash your hands often with soap and warm water for at least 20 seconds.  If soap and water are not readily available, use an alcohol-based hand sanitizer with at least 60% alcohol.  If coughing or sneezing, cover your mouth and nose by coughing or sneezing into the elbow areas of your shirt or coat, into a tissue or into your sleeve (not your hands). Avoid shaking hands with others and consider head nods or verbal greetings only. Avoid touching your eyes, nose, or mouth with unwashed hands.  Avoid close contact with people who are sick. Avoid places or events with large numbers of people in one location, like concerts or sporting events. Carefully consider travel plans you have or are making. If you are planning any travel outside or inside the [place], visit the CDC's Travelers' Health webpage for the latest health notices. If you have some symptoms but not all symptoms, continue to monitor at home and seek medical attention if your symptoms worsen. If you are having a medical emergency, call 911.  HOME CARE Only take medications as instructed by your medical team. Drink plenty of fluids and get plenty of rest. A steam or ultrasonic humidifier can help if you have congestion.   GET HELP RIGHT AWAY IF YOU HAVE EMERGENCY WARNING SIGNS** FOR COVID-19. If you or someone is showing any of these signs seek emergency medical care immediately. Call 911 or proceed to your closest emergency facility if: You develop worsening high fever. Trouble breathing Bluish lips or face  Persistent pain or pressure in the chest New confusion Inability to wake or stay awake You cough up blood. Your symptoms become more severe  **This list is not all possible symptoms. Contact your medical provider for any symptoms that are sever or concerning to you.  MAKE SURE YOU  Understand these instructions. Will watch your condition. Will get help right away if you are not doing well or get  worse.  Your e-visit answers were reviewed by a board certified advanced clinical practitioner to complete your personal care plan.  Depending on the condition, your plan could have included both over the counter or prescription medications.  If there is a problem please reply once you have received a response from your provider.  Your safety is important to [place].  If you have drug allergies check your prescription carefully.    You can use MyChart to ask questions about today's visit, request a non-urgent call back, or ask for a work or school excuse for 24 hours related to this e-Visit. If it has been greater than 24 hours you will need to follow up with your provider, or enter a new e-Visit to address those concerns. You will get an e-mail in the next two days asking about your experience.  I hope that your e-visit has been valuable and will speed your recovery. Thank you for using e-visits.

## 2022-04-17 NOTE — Progress Notes (Signed)
I have spent 5 minutes in review of e-visit questionnaire, review and updating patient chart, medical decision making and response to patient.   Daniah Zaldivar Cody Abdoul Encinas, PA-C    

## 2022-04-17 NOTE — Progress Notes (Signed)
[place]  {a

## 2022-05-10 ENCOUNTER — Telehealth: Payer: Medicaid Other | Admitting: Physician Assistant

## 2022-05-10 DIAGNOSIS — J028 Acute pharyngitis due to other specified organisms: Secondary | ICD-10-CM | POA: Diagnosis not present

## 2022-05-10 DIAGNOSIS — B9689 Other specified bacterial agents as the cause of diseases classified elsewhere: Secondary | ICD-10-CM

## 2022-05-10 MED ORDER — AZITHROMYCIN 250 MG PO TABS: ORAL_TABLET | ORAL | 0 refills | Status: AC

## 2022-05-10 NOTE — Progress Notes (Signed)

## 2022-06-28 ENCOUNTER — Institutional Professional Consult (permissible substitution): Payer: Medicaid Other | Admitting: Neurology

## 2022-07-16 ENCOUNTER — Telehealth: Payer: Medicaid Other | Admitting: Physician Assistant

## 2022-07-16 DIAGNOSIS — J069 Acute upper respiratory infection, unspecified: Secondary | ICD-10-CM | POA: Diagnosis not present

## 2022-07-16 MED ORDER — FLUTICASONE PROPIONATE 50 MCG/ACT NA SUSP: 2.0000 | Freq: Every day | NASAL | 0 refills | Status: DC

## 2022-07-16 MED ORDER — PROMETHAZINE-DM 6.25-15 MG/5ML PO SYRP: 5.0000 mL | ORAL_SOLUTION | Freq: Four times a day (QID) | ORAL | 0 refills | Status: DC | PRN

## 2022-07-16 MED ORDER — BENZONATATE 100 MG PO CAPS: 100.0000 mg | ORAL_CAPSULE | Freq: Three times a day (TID) | ORAL | 0 refills | Status: DC | PRN

## 2022-07-16 MED ORDER — PREDNISONE 10 MG (21) PO TBPK: ORAL_TABLET | ORAL | 0 refills | Status: DC

## 2022-07-16 NOTE — Patient Instructions (Signed)
[name], thank you for joining Mar Daring, PA-C for today's virtual visit.  While this provider is not your primary care provider (PCP), if your PCP is located in our provider database this encounter information will be shared with them immediately following your visit.   [facility] [place] account gives you access to today's visit and all your visits, tests, and labs performed at [facility] " click here if you don't have a [facility] account or go to [email or web address]  Consent: (Patient) Yvonne Black provided verbal consent for this virtual visit at the beginning of the encounter.  Current Medications:  Current Outpatient Medications:    benzonatate (TESSALON) 100 MG capsule, Take 1 capsule (100 mg total) by mouth 3 (three) times daily as needed., Disp: 30 capsule, Rfl: 0   fluticasone (FLONASE) 50 MCG/ACT nasal spray, Place 2 sprays into both nostrils daily., Disp: 16 g, Rfl: 0   predniSONE (STERAPRED UNI-PAK 21 TAB) 10 MG (21) TBPK tablet, 6 day taper; take as directed on package instructions, Disp: 21 tablet, Rfl: 0   promethazine-dextromethorphan (PROMETHAZINE-DM) 6.25-15 MG/5ML syrup, Take 5 mLs by mouth 4 (four) times daily as needed., Disp: 118 mL, Rfl: 0   omeprazole (PRILOSEC) 20 MG capsule, Take 1 capsule (20 mg total) by mouth daily., Disp: 30 capsule, Rfl: 3   phentermine 37.5 MG capsule, Take 1 capsule (37.5 mg total) by mouth every morning., Disp: 30 capsule, Rfl: 2   Medications ordered in this encounter:  Meds ordered this encounter  Medications   predniSONE (STERAPRED UNI-PAK 21 TAB) 10 MG (21) TBPK tablet    Sig: 6 day taper; take as directed on package instructions    Dispense:  21 tablet    Refill:  0    Order Specific Question:   Supervising Provider    Answer:   Chase Picket [[id number]]   fluticasone (FLONASE) 50 MCG/ACT nasal spray    Sig: Place 2 sprays into both nostrils daily.    Dispense:  16 g    Refill:  0     Order Specific Question:   Supervising Provider    Answer:   Chase Picket [id number]   promethazine-dextromethorphan (PROMETHAZINE-DM) 6.25-15 MG/5ML syrup    Sig: Take 5 mLs by mouth 4 (four) times daily as needed.    Dispense:  118 mL    Refill:  0    Order Specific Question:   Supervising Provider    Answer:   Chase Picket [[id number]]   benzonatate (TESSALON) 100 MG capsule    Sig: Take 1 capsule (100 mg total) by mouth 3 (three) times daily as needed.    Dispense:  30 capsule    Refill:  0    Order Specific Question:   Supervising Provider    Answer:   Chase Picket [id number]     *If you need refills on other medications prior to your next appointment, please contact your pharmacy*  Follow-Up: Call back or seek an in-person evaluation if the symptoms worsen or if the condition fails to improve as anticipated.  [place] [phone]  Other Instructions  Upper Respiratory Infection, Adult An upper respiratory infection (URI) is a common viral infection of the nose, throat, and upper air passages that lead to the lungs. The most common type of URI is the common cold. URIs usually get better on their own, without medical treatment. What are the causes? A URI is caused by a virus. You may  catch a virus by: Breathing in droplets from an infected person's cough or sneeze. Touching something that has been exposed to the virus (is contaminated) and then touching your mouth, nose, or eyes. What increases the risk? You are more likely to get a URI if: You are very young or very old. You have close contact with others, such as at work, school, or a health care facility. You smoke. You have long-term (chronic) heart or lung disease. You have a weakened disease-fighting system (immune system). You have nasal allergies or asthma. You are experiencing a lot of stress. You have poor nutrition. What are the signs or symptoms? A URI usually involves some of  the following symptoms: Runny or stuffy (congested) nose. Cough. Sneezing. Sore throat. Headache. Fatigue. Fever. Loss of appetite. Pain in your forehead, behind your eyes, and over your cheekbones (sinus pain). Muscle aches. Redness or irritation of the eyes. Pressure in the ears or face. How is this diagnosed? This condition may be diagnosed based on your medical history and symptoms, and a physical exam. Your health care provider may use a swab to take a mucus sample from your nose (nasal swab). This sample can be tested to determine what virus is causing the illness. How is this treated? URIs usually get better on their own within 7-10 days. Medicines cannot cure URIs, but your health care provider may recommend certain medicines to help relieve symptoms, such as: Over-the-counter cold medicines. Cough suppressants. Coughing is a type of defense against infection that helps to clear the respiratory system, so take these medicines only as recommended by your health care provider. Fever-reducing medicines. Follow these instructions at home: Activity Rest as needed. If you have a fever, stay home from work or school until your fever is gone or until your health care provider says your URI cannot spread to other people (is no longer contagious). Your health care provider may have you wear a face mask to prevent your infection from spreading. Relieving symptoms Gargle with a mixture of salt and water 3-4 times a day or as needed. To make salt water, completely dissolve -1 tsp (3-6 g) of salt in 1 cup (237 mL) of warm water. Use a cool-mist humidifier to add moisture to the air. This can help you breathe more easily. Eating and drinking  Drink enough fluid to keep your urine pale yellow. Eat soups and other clear broths. General instructions  Take over-the-counter and prescription medicines only as told by your health care provider. These include cold medicines, fever reducers, and  cough suppressants. Do not use any products that contain nicotine or tobacco. These products include cigarettes, chewing tobacco, and vaping devices, such as e-cigarettes. If you need help quitting, ask your health care provider. Stay away from secondhand smoke. Stay up to date on all immunizations, including the yearly (annual) flu vaccine. Keep all follow-up visits. This is important. How to prevent the spread of infection to others URIs can be contagious. To prevent the infection from spreading: Wash your hands with soap and water for at least 20 seconds. If soap and water are not available, use hand sanitizer. Avoid touching your mouth, face, eyes, or nose. Cough or sneeze into a tissue or your sleeve or elbow instead of into your hand or into the air.  Contact a health care provider if: You are getting worse instead of better. You have a fever or chills. Your mucus is brown or red. You have yellow or brown discharge coming from your  nose. You have pain in your face, especially when you bend forward. You have swollen neck glands. You have pain while swallowing. You have white areas in the back of your throat. Get help right away if: You have shortness of breath that gets worse. You have severe or persistent: Headache. Ear pain. Sinus pain. Chest pain. You have chronic lung disease along with any of the following: Making high-pitched whistling sounds when you breathe, most often when you breathe out (wheezing). Prolonged cough (more than 14 days). Coughing up blood. A change in your usual mucus. You have a stiff neck. You have changes in your: Vision. Hearing. Thinking. Mood. These symptoms may be an emergency. Get help right away. Call 911. Do not wait to see if the symptoms will go away. Do not drive yourself to the hospital. Summary An upper respiratory infection (URI) is a common infection of the nose, throat, and upper air passages that lead to the lungs. A URI is  caused by a virus. URIs usually get better on their own within 7-10 days. Medicines cannot cure URIs, but your health care provider may recommend certain medicines to help relieve symptoms. This information is not intended to replace advice given to you by your health care provider. Make sure you discuss any questions you have with your health care provider. Document Revised: 02/01/2021 Document Reviewed: 02/01/2021 Elsevier Patient Education  [place].    If you have been instructed to have an in-person evaluation today at a local Urgent Care facility, please use the link below. It will take you to a list of all of our available [facility], including address, phone number and hours of operation. Please do not delay care.  [facility]  If you or a family member do not have a primary care provider, use the link below to schedule a visit and establish care. When you choose a [facility] primary care physician or advanced practice provider, you gain a long-term partner in health. Find a Primary Care Provider  Learn more about [facility]'s in-office and virtual care options: [facility]

## 2022-07-16 NOTE — Progress Notes (Signed)
Virtual Visit Consent   Yvonne Black, you are scheduled for a virtual visit with a [facility] provider today. Just as with appointments in the office, your consent must be obtained to participate. Your consent will be active for this visit and any virtual visit you may have with one of our providers in the next 365 days. If you have a MyChart account, a copy of this consent can be sent to you electronically.  As this is a virtual visit, video technology does not allow for your provider to perform a traditional examination. This may limit your provider's ability to fully assess your condition. If your provider identifies any concerns that need to be evaluated in person or the need to arrange testing (such as labs, EKG, etc.), we will make arrangements to do so. Although advances in technology are sophisticated, we cannot ensure that it will always work on either your end or our end. If the connection with a video visit is poor, the visit may have to be switched to a telephone visit. With either a video or telephone visit, we are not always able to ensure that we have a secure connection.  By engaging in this virtual visit, you consent to the provision of healthcare and authorize for your insurance to be billed (if applicable) for the services provided during this visit. Depending on your insurance coverage, you may receive a charge related to this service.  I need to obtain your verbal consent now. Are you willing to proceed with your visit today? Shantoria Ellwood has provided verbal consent on 07/16/2022 for a virtual visit (video or telephone). Mar Daring, PA-C  Date: 07/16/2022 8:53 AM  Virtual Visit via Video Note   I, Mar Daring, connected with  Yvonne Black  ([id number], [birth year]) on 07/16/22 at  8:30 AM EST by a video-enabled telemedicine application and verified that I am speaking with the correct person using two identifiers.  Location: Patient: Virtual Visit Location Patient:  Home Provider: Virtual Visit Location Provider: Home Office   I discussed the limitations of evaluation and management by telemedicine and the availability of in person appointments. The patient expressed understanding and agreed to proceed.    History of Present Illness: Yvonne Black is a 37 y.o. who identifies as a female who was assigned female at birth, and is being seen today for cough.  HPI: Cough This is a new problem. The current episode started in the past 7 days (07/13/22). The problem has been gradually worsening. The cough is Productive of sputum and productive of purulent sputum (did cough a mucus plug out yesterday). Associated symptoms include chills, a fever, headaches, nasal congestion, postnasal drip, rhinorrhea and a sore throat. Associated symptoms comments: Fatigue, vomiting. The symptoms are aggravated by lying down. Treatments tried: ibuprofen. The treatment provided no relief.     Problems:  Patient Active Problem List   Diagnosis Date Noted   Snoring 02/02/2022   Encounter to establish care 12/09/2020   Chronic midline thoracic back pain 12/09/2020   IUD (intrauterine device) in place 11/30/2019   History of cesarean section 10/09/2019   Urge incontinence 10/09/2019   History of anemia 10/09/2019   BMI 31.0-31.9,adult 10/09/2019   Hot flashes 10/09/2019   Weight gain 10/09/2019   Premature ovarian failure 08/29/2017    Allergies:  Allergies  Allergen Reactions   Iodine Anaphylaxis   Latex Hives   Penicillins    Shellfish Allergy    Medications:  Current Outpatient Medications:  benzonatate (TESSALON) 100 MG capsule, Take 1 capsule (100 mg total) by mouth 3 (three) times daily as needed., Disp: 30 capsule, Rfl: 0   fluticasone (FLONASE) 50 MCG/ACT nasal spray, Place 2 sprays into both nostrils daily., Disp: 16 g, Rfl: 0   predniSONE (STERAPRED UNI-PAK 21 TAB) 10 MG (21) TBPK tablet, 6 day taper; take as directed on package instructions, Disp: 21 tablet,  Rfl: 0   promethazine-dextromethorphan (PROMETHAZINE-DM) 6.25-15 MG/5ML syrup, Take 5 mLs by mouth 4 (four) times daily as needed., Disp: 118 mL, Rfl: 0   omeprazole (PRILOSEC) 20 MG capsule, Take 1 capsule (20 mg total) by mouth daily., Disp: 30 capsule, Rfl: 3   phentermine 37.5 MG capsule, Take 1 capsule (37.5 mg total) by mouth every morning., Disp: 30 capsule, Rfl: 2  Observations/Objective: Patient is well-developed, well-nourished in no acute distress.  Resting comfortably at home.  Head is normocephalic, atraumatic.  No labored breathing.  Speech is clear and coherent with logical content.  Patient is alert and oriented at baseline.    Assessment and Plan: 1. Viral URI with cough - predniSONE (STERAPRED UNI-PAK 21 TAB) 10 MG (21) TBPK tablet; 6 day taper; take as directed on package instructions  Dispense: 21 tablet; Refill: 0 - fluticasone (FLONASE) 50 MCG/ACT nasal spray; Place 2 sprays into both nostrils daily.  Dispense: 16 g; Refill: 0 - promethazine-dextromethorphan (PROMETHAZINE-DM) 6.25-15 MG/5ML syrup; Take 5 mLs by mouth 4 (four) times daily as needed.  Dispense: 118 mL; Refill: 0 - benzonatate (TESSALON) 100 MG capsule; Take 1 capsule (100 mg total) by mouth 3 (three) times daily as needed.  Dispense: 30 capsule; Refill: 0  - Suspect viral URI - Prednisone, flonase, tessalon perles and Promethazine DM prescribed - Symptomatic medications of choice over the counter as needed - Push fluids - Rest - Seek further evaluation if symptoms change or worsen   Follow Up Instructions: I discussed the assessment and treatment plan with the patient. The patient was provided an opportunity to ask questions and all were answered. The patient agreed with the plan and demonstrated an understanding of the instructions.  A copy of instructions were sent to the patient via MyChart unless otherwise noted below.    The patient was advised to call back or seek an in-person evaluation if  the symptoms worsen or if the condition fails to improve as anticipated.  Time:  I spent 15 minutes with the patient via telehealth technology discussing the above problems/concerns.    Mar Daring, PA-C

## 2022-07-18 ENCOUNTER — Encounter: Payer: Self-pay | Admitting: Neurology

## 2022-07-18 ENCOUNTER — Institutional Professional Consult (permissible substitution): Payer: Medicaid Other | Admitting: Neurology

## 2022-07-26 ENCOUNTER — Telehealth: Payer: Medicaid Other | Admitting: Family

## 2022-07-26 ENCOUNTER — Encounter: Payer: Medicaid Other | Admitting: Family

## 2022-07-26 NOTE — Progress Notes (Signed)
This encounter was created in error - please disregard. Patient called multiple times by CMA but did not answer.

## 2022-08-03 ENCOUNTER — Ambulatory Visit: Payer: Medicaid Other | Admitting: Family

## 2022-08-05 ENCOUNTER — Telehealth: Payer: Medicaid Other | Admitting: Nurse Practitioner

## 2022-08-05 DIAGNOSIS — B9689 Other specified bacterial agents as the cause of diseases classified elsewhere: Secondary | ICD-10-CM

## 2022-08-05 DIAGNOSIS — J069 Acute upper respiratory infection, unspecified: Secondary | ICD-10-CM | POA: Diagnosis not present

## 2022-08-05 DIAGNOSIS — H00014 Hordeolum externum left upper eyelid: Secondary | ICD-10-CM

## 2022-08-05 MED ORDER — AZITHROMYCIN 250 MG PO TABS: ORAL_TABLET | ORAL | 0 refills | Status: AC

## 2022-08-05 MED ORDER — BACITRACIN-POLYMYXIN B 500-10000 UNIT/GM OP OINT: 1.0000 | TOPICAL_OINTMENT | Freq: Two times a day (BID) | OPHTHALMIC | 0 refills | Status: DC

## 2022-08-05 NOTE — Patient Instructions (Signed)
Yvonne Black, thank you for joining Gildardo Pounds, NP for today's virtual visit.  While this provider is not your primary care provider (PCP), if your PCP is located in our provider database this encounter information will be shared with them immediately following your visit.   [facility] account gives you access to today's visit and all your visits, tests, and labs performed at [facility] " click here if you don't have a [facility] account or go to [email or web address]  Consent: (Patient) Yvonne Black provided verbal consent for this virtual visit at the beginning of the encounter.  Current Medications:  Current Outpatient Medications:    azithromycin (ZITHROMAX) 250 MG tablet, Take 2 tablets on day 1, then 1 tablet daily on days 2 through 5, Disp: 6 tablet, Rfl: 0   bacitracin-polymyxin b (POLYSPORIN) ophthalmic ointment, Place 1 Application into the left eye every 12 (twelve) hours. apply to eye every 12 hours while awake, Disp: 3.5 g, Rfl: 0   benzonatate (TESSALON) 100 MG capsule, Take 1 capsule (100 mg total) by mouth 3 (three) times daily as needed., Disp: 30 capsule, Rfl: 0   fluticasone (FLONASE) 50 MCG/ACT nasal spray, Place 2 sprays into both nostrils daily., Disp: 16 g, Rfl: 0   omeprazole (PRILOSEC) 20 MG capsule, Take 1 capsule (20 mg total) by mouth daily., Disp: 30 capsule, Rfl: 3   phentermine 37.5 MG capsule, Take 1 capsule (37.5 mg total) by mouth every morning., Disp: 30 capsule, Rfl: 2   predniSONE (STERAPRED UNI-PAK 21 TAB) 10 MG (21) TBPK tablet, 6 day taper; take as directed on package instructions, Disp: 21 tablet, Rfl: 0   promethazine-dextromethorphan (PROMETHAZINE-DM) 6.25-15 MG/5ML syrup, Take 5 mLs by mouth 4 (four) times daily as needed., Disp: 118 mL, Rfl: 0   Medications ordered in this encounter:  Meds ordered this encounter  Medications   azithromycin (ZITHROMAX) 250 MG tablet    Sig: Take 2 tablets on day 1, then 1  tablet daily on days 2 through 5    Dispense:  6 tablet    Refill:  0    Order Specific Question:   Supervising Provider    Answer:   Chase Picket [[id number]]   bacitracin-polymyxin b (POLYSPORIN) ophthalmic ointment    Sig: Place 1 Application into the left eye every 12 (twelve) hours. apply to eye every 12 hours while awake    Dispense:  3.5 g    Refill:  0    Order Specific Question:   Supervising Provider    Answer:   Chase Picket [[id number]]     *If you need refills on other medications prior to your next appointment, please contact your pharmacy*  Follow-Up: Call back or seek an in-person evaluation if the symptoms worsen or if the condition fails to improve as anticipated.  [facility] [phone]  Other Instructions INSTRUCTIONS: use a humidifier for nasal congestion Drink plenty of fluids, rest and wash hands frequently to avoid the spread of infection Alternate tylenol and Motrin for relief of fever   Apply warm compresses to left eye as often as possible for pain relief.    If you have been instructed to have an in-person evaluation today at a local Urgent Care facility, please use the link below. It will take you to a list of all of our available [facility], including address, phone number and hours of operation. Please do not delay care.  [facility]  If you or a family member do not have a primary care provider, use the link below to schedule a visit and establish care. When you choose a [facility] primary care physician or advanced practice provider, you gain a long-term partner in health. Find a Primary Care Provider  Learn more about [facility]'s in-office and virtual care options: [facility]

## 2022-08-05 NOTE — Progress Notes (Signed)
Virtual Visit Consent   Yvonne Black, you are scheduled for a virtual visit with a [facility] provider today. Just as with appointments in the office, your consent must be obtained to participate. Your consent will be active for this visit and any virtual visit you may have with one of our providers in the next 365 days. If you have a MyChart account, a copy of this consent can be sent to you electronically.  As this is a virtual visit, video technology does not allow for your provider to perform a traditional examination. This may limit your provider's ability to fully assess your condition. If your provider identifies any concerns that need to be evaluated in person or the need to arrange testing (such as labs, EKG, etc.), we will make arrangements to do so. Although advances in technology are sophisticated, we cannot ensure that it will always work on either your end or our end. If the connection with a video visit is poor, the visit may have to be switched to a telephone visit. With either a video or telephone visit, we are not always able to ensure that we have a secure connection.  By engaging in this virtual visit, you consent to the provision of healthcare and authorize for your insurance to be billed (if applicable) for the services provided during this visit. Depending on your insurance coverage, you may receive a charge related to this service.  I need to obtain your verbal consent now. Are you willing to proceed with your visit today? Yvonne Black has provided verbal consent on 08/05/2022 for a virtual visit (video or telephone). Yvonne Pounds, NP  Date: 08/05/2022 11:46 AM  Virtual Visit via Video Note   I, Yvonne Black, connected with  Yvonne Black  ([id number], [birth year]) on 08/05/22 at 11:30 AM EST by a video-enabled telemedicine application and verified that I am speaking with the correct person using two identifiers.  Location: Patient: Virtual Visit Location Patient:  Home Provider: Virtual Visit Location Provider: Home Office   I discussed the limitations of evaluation and management by telemedicine and the availability of in person appointments. The patient expressed understanding and agreed to proceed.    History of Present Illness: Yvonne Black is a 37 y.o. who identifies as a female who was assigned female at birth, and is being seen today for URI with cough and left eye stye.   Yvonne Black was treated for viral URI on 07-16-2022 with prednisone, flonase nasal spray, cough syrup and tessalon perles. Today she notes persistent chest congestion and productive cough. She also has a stye on the left upper eye lid.    Problems:  Patient Active Problem List   Diagnosis Date Noted   Snoring 02/02/2022   Encounter to establish care 12/09/2020   Chronic midline thoracic back pain 12/09/2020   IUD (intrauterine device) in place 11/30/2019   History of cesarean section 10/09/2019   Urge incontinence 10/09/2019   History of anemia 10/09/2019   BMI 31.0-31.9,adult 10/09/2019   Hot flashes 10/09/2019   Weight gain 10/09/2019   Premature ovarian failure 08/29/2017    Allergies:  Allergies  Allergen Reactions   Iodine Anaphylaxis   Latex Hives   Penicillins    Shellfish Allergy    Medications:  Current Outpatient Medications:    azithromycin (ZITHROMAX) 250 MG tablet, Take 2 tablets on day 1, then 1 tablet daily on days 2 through 5, Disp: 6 tablet, Rfl: 0   bacitracin-polymyxin b (POLYSPORIN) ophthalmic ointment, Place  1 Application into the left eye every 12 (twelve) hours. apply to eye every 12 hours while awake, Disp: 3.5 g, Rfl: 0   benzonatate (TESSALON) 100 MG capsule, Take 1 capsule (100 mg total) by mouth 3 (three) times daily as needed., Disp: 30 capsule, Rfl: 0   fluticasone (FLONASE) 50 MCG/ACT nasal spray, Place 2 sprays into both nostrils daily., Disp: 16 g, Rfl: 0   omeprazole (PRILOSEC) 20 MG capsule, Take 1 capsule (20 mg total) by mouth  daily., Disp: 30 capsule, Rfl: 3   phentermine 37.5 MG capsule, Take 1 capsule (37.5 mg total) by mouth every morning., Disp: 30 capsule, Rfl: 2   predniSONE (STERAPRED UNI-PAK 21 TAB) 10 MG (21) TBPK tablet, 6 day taper; take as directed on package instructions, Disp: 21 tablet, Rfl: 0   promethazine-dextromethorphan (PROMETHAZINE-DM) 6.25-15 MG/5ML syrup, Take 5 mLs by mouth 4 (four) times daily as needed., Disp: 118 mL, Rfl: 0  Observations/Objective: Patient is well-developed, well-nourished in no acute distress.  Resting comfortably at home.  Head is normocephalic, atraumatic.  No labored breathing.  Speech is clear and coherent with logical content.  Patient is alert and oriented at baseline.    Assessment and Plan: 1. Bacterial URI - azithromycin (ZITHROMAX) 250 MG tablet; Take 2 tablets on day 1, then 1 tablet daily on days 2 through 5  Dispense: 6 tablet; Refill: 0  2. Hordeolum externum of left upper eyelid - bacitracin-polymyxin b (POLYSPORIN) ophthalmic ointment; Place 1 Application into the left eye every 12 (twelve) hours. apply to eye every 12 hours while awake  Dispense: 3.5 g; Refill: 0  INSTRUCTIONS: use a humidifier for nasal congestion Drink plenty of fluids, rest and wash hands frequently to avoid the spread of infection Alternate tylenol and Motrin for relief of fever   Apply warm compresses to left eye as often as possible for pain relief.   Follow Up Instructions: I discussed the assessment and treatment plan with the patient. The patient was provided an opportunity to ask questions and all were answered. The patient agreed with the plan and demonstrated an understanding of the instructions.  A copy of instructions were sent to the patient via MyChart unless otherwise noted below.    The patient was advised to call back or seek an in-person evaluation if the symptoms worsen or if the condition fails to improve as anticipated.  Time:  I spent 11 minutes with  the patient via telehealth technology discussing the above problems/concerns.    Yvonne Pounds, NP

## 2022-08-20 ENCOUNTER — Encounter: Payer: Medicaid Other | Admitting: Family

## 2022-08-20 NOTE — Progress Notes (Signed)
  This encounter was created in error - please disregard. No show 

## 2022-08-24 ENCOUNTER — Encounter: Payer: Medicaid Other | Admitting: Nurse Practitioner

## 2022-08-24 NOTE — Progress Notes (Signed)
Because we have not heard back regarding clarity on your symptoms, I feel your condition warrants further evaluation and I recommend that you be seen in a face to face visit.   NOTE: There will be NO CHARGE for this eVisit   If you are having a true medical emergency please call 911.      For an urgent face to face visit, [facility] has eight urgent care centers for your convenience:   NEW!! [facility] [place] [facility] [place] Get Driving Directions [id number] [address] [place], [place] [facility] [place] [facility] [place] Get Driving Directions [id number] [place], [place] [address]   [facility]) Get Driving Directions [id number] [address], [place] [address]  [facility] [place] ([place]) Get Driving Directions [id number] [address] [place],  [place]  [address]  [facility] - at [place] Get Driving Directions  [phone] [address] [place],  [place] [address]   [facility] Get Driving Directions [id number] [place], [place], [place] [address]   [facility] Get Driving Directions  [id number] [address] [place], [place] [address]   [facility] [place] Get Driving Directions [id number] [address], [place], [place] [address]  Your MyChart E-visit questionnaire answers were reviewed by a board certified advanced clinical practitioner to complete your personal care plan based on your specific symptoms.  Thank you for using e-Visits.

## 2022-10-16 ENCOUNTER — Encounter: Payer: Medicaid Other | Admitting: Family

## 2022-10-22 ENCOUNTER — Encounter: Payer: Self-pay | Admitting: Family

## 2022-10-22 ENCOUNTER — Ambulatory Visit (INDEPENDENT_AMBULATORY_CARE_PROVIDER_SITE_OTHER): Payer: Medicaid Other | Admitting: Family

## 2022-10-22 VITALS — BP 130/86 | HR 84 | Temp 97.4°F | Resp 20 | Ht 65.0 in | Wt 217.0 lb

## 2022-10-22 DIAGNOSIS — M542 Cervicalgia: Secondary | ICD-10-CM

## 2022-10-22 DIAGNOSIS — M549 Dorsalgia, unspecified: Secondary | ICD-10-CM

## 2022-10-22 DIAGNOSIS — E669 Obesity, unspecified: Secondary | ICD-10-CM

## 2022-10-22 DIAGNOSIS — N62 Hypertrophy of breast: Secondary | ICD-10-CM

## 2022-10-22 DIAGNOSIS — R0981 Nasal congestion: Secondary | ICD-10-CM

## 2022-10-22 DIAGNOSIS — G8929 Other chronic pain: Secondary | ICD-10-CM | POA: Diagnosis not present

## 2022-10-22 DIAGNOSIS — Z0000 Encounter for general adult medical examination without abnormal findings: Secondary | ICD-10-CM

## 2022-10-22 DIAGNOSIS — L2082 Flexural eczema: Secondary | ICD-10-CM

## 2022-10-22 DIAGNOSIS — M25512 Pain in left shoulder: Secondary | ICD-10-CM

## 2022-10-22 MED ORDER — TRIAMCINOLONE ACETONIDE 0.1 % EX CREA: 1.0000 | TOPICAL_CREAM | Freq: Two times a day (BID) | CUTANEOUS | 3 refills | Status: DC

## 2022-10-22 MED ORDER — FLUTICASONE PROPIONATE 50 MCG/ACT NA SUSP: 2.0000 | Freq: Every day | NASAL | 0 refills | Status: DC

## 2022-10-22 MED ORDER — PHENTERMINE HCL 37.5 MG PO CAPS: 37.5000 mg | ORAL_CAPSULE | ORAL | 2 refills | Status: AC

## 2022-10-22 NOTE — Progress Notes (Signed)
Provider: Richarda Bladeinah Trejuan Matherne FNP-C   Raja Caputi, Donalee Citrininah C, NP  Patient Care Team: Andrew Soria, Donalee Citrininah C, NP as PCP - General (Family Medicine)  No emergency contact information on file.  Code Status:  Full Code  Goals of care: Advanced Directive information    07/28/2021    1:29 PM  Advanced Directives  Does Patient Have a Medical Advance Directive? No  Would patient like information on creating a medical advance directive? No - Patient declined     Chief Complaint  Patient presents with   Annual Exam    HPI:  Pt is a 37 y.o. female seen today for annual Physical examination and medical management of chronic diseases.   She request referral to plastic surgeon for bilateral breast reduction.Has had reduction in the past but breast have become enlarged again resulting in upper back pain,neck and shoulder pain. Has used spots bra without any relief. Has had weight gain would like to get back on Phentermine.Previous weight was 204 lbs  weight today 217 lbs.No routine form of exercise or dietary modification.  States fasting today for labs.  Had IUD which was removed by her Gynecology.continues to follow up with Gyn.  Past Medical History:  Diagnosis Date   Hormone disorder    Past Surgical History:  Procedure Laterality Date   BREAST SURGERY  12/19/2016   Reduction   CESAREAN SECTION  05/03/2010   CYST REMOVAL HAND     tummy tuck  12/19/2016    Allergies  Allergen Reactions   Iodine Anaphylaxis   Latex Hives   Penicillins    Shellfish Allergy     Allergies as of 10/22/2022       Reactions   Iodine Anaphylaxis   Latex Hives   Penicillins    Shellfish Allergy         Medication List        Accurate as of October 22, 2022  3:40 PM. If you have any questions, ask your nurse or doctor.          STOP taking these medications    bacitracin-polymyxin b ophthalmic ointment Commonly known as: POLYSPORIN Stopped by: Donalee Citrininah C Kellsey Sansone, NP   benzonatate 100 MG  capsule Commonly known as: TESSALON Stopped by: Caesar Bookmaninah C Graciela Plato, NP   predniSONE 10 MG (21) Tbpk tablet Commonly known as: STERAPRED UNI-PAK 21 TAB Stopped by: Caesar Bookmaninah C Modest Draeger, NP   promethazine-dextromethorphan 6.25-15 MG/5ML syrup Commonly known as: PROMETHAZINE-DM Stopped by: Caesar Bookmaninah C Havish Petties, NP       TAKE these medications    fluticasone 50 MCG/ACT nasal spray Commonly known as: FLONASE Place 2 sprays into both nostrils daily.   omeprazole 20 MG capsule Commonly known as: PRILOSEC Take 1 capsule (20 mg total) by mouth daily.   phentermine 37.5 MG capsule Take 1 capsule (37.5 mg total) by mouth every morning.        Review of Systems  Constitutional:  Negative for appetite change, chills, fatigue, fever and unexpected weight change.  HENT:  Positive for congestion. Negative for dental problem, ear discharge, ear pain, facial swelling, hearing loss, nosebleeds, postnasal drip, rhinorrhea, sinus pressure, sinus pain, sneezing, sore throat, tinnitus and trouble swallowing.   Eyes:  Negative for pain, discharge, redness, itching and visual disturbance.  Respiratory:  Negative for cough, chest tightness, shortness of breath and wheezing.   Cardiovascular:  Negative for chest pain, palpitations and leg swelling.  Gastrointestinal:  Negative for abdominal distention, abdominal pain, blood in stool, constipation, diarrhea, nausea and  vomiting.  Endocrine: Negative for cold intolerance, heat intolerance, polydipsia, polyphagia and polyuria.  Genitourinary:  Negative for difficulty urinating, dysuria, flank pain, frequency and urgency.  Musculoskeletal:  Negative for arthralgias, gait problem, joint swelling, myalgias and neck stiffness.       Reports upper back,neck and shoulder pain due to large breast   Skin:  Negative for color change, pallor, rash and wound.  Neurological:  Negative for dizziness, syncope, speech difficulty, weakness, light-headedness, numbness and  headaches.  Hematological:  Does not bruise/bleed easily.  Psychiatric/Behavioral:  Negative for agitation, behavioral problems, confusion, hallucinations, self-injury, sleep disturbance and suicidal ideas. The patient is not nervous/anxious.     Immunization History  Administered Date(s) Administered   Influenza-Unspecified 03/30/2010   PFIZER(Purple Top)SARS-COV-2 Vaccination 02/14/2020, 03/07/2020   Tdap 05/04/2010, 10/09/2019   Pertinent  Health Maintenance Due  Topic Date Due   PAP SMEAR-Modifier  08/15/2024   INFLUENZA VACCINE  Discontinued      04/08/2018    8:13 AM 06/22/2021    1:03 PM 07/28/2021    1:29 PM  Fall Risk  Falls in the past year?   0  Was there an injury with Fall?   0  Fall Risk Category Calculator   0  Fall Risk Category (Retired)   Low  (RETIRED) Patient Fall Risk Level Low fall risk Low fall risk Low fall risk  Patient at Risk for Falls Due to   No Fall Risks  Fall risk Follow up   Falls evaluation completed   Functional Status Survey: Is the patient deaf or have difficulty hearing?: No Does the patient have difficulty seeing, even when wearing glasses/contacts?: No Does the patient have difficulty walking or climbing stairs?: No Does the patient have difficulty dressing or bathing?: No Does the patient have difficulty doing errands alone such as visiting a doctor's office or shopping?: No  Vitals:   10/22/22 1519  BP: 130/86  Pulse: 84  Resp: 20  Temp: (!) 97.4 F (36.3 C)  SpO2: 97%  Weight: 217 lb (98.4 kg)  Height: 5\' 5"  (1.651 m)   Body mass index is 36.11 kg/m. Physical Exam Vitals reviewed.  Constitutional:      General: She is not in acute distress.    Appearance: Normal appearance. She is obese. She is not ill-appearing or diaphoretic.  HENT:     Head: Normocephalic.     Right Ear: Tympanic membrane, ear canal and external ear normal. There is no impacted cerumen.     Left Ear: Tympanic membrane, ear canal and external ear  normal. There is no impacted cerumen.     Nose: Nose normal. No congestion or rhinorrhea.     Mouth/Throat:     Mouth: Mucous membranes are moist.     Pharynx: Oropharynx is clear. No oropharyngeal exudate or posterior oropharyngeal erythema.  Eyes:     General: No scleral icterus.       Right eye: No discharge.        Left eye: No discharge.     Extraocular Movements: Extraocular movements intact.     Conjunctiva/sclera: Conjunctivae normal.     Pupils: Pupils are equal, round, and reactive to light.  Neck:     Vascular: No carotid bruit.  Cardiovascular:     Rate and Rhythm: Normal rate and regular rhythm.     Pulses: Normal pulses.     Heart sounds: Normal heart sounds. No murmur heard.    No friction rub. No gallop.  Pulmonary:  Effort: Pulmonary effort is normal. No respiratory distress.     Breath sounds: Normal breath sounds. No wheezing, rhonchi or rales.  Chest:     Chest wall: No tenderness.     Comments: Large breast size  Abdominal:     General: Bowel sounds are normal. There is no distension.     Palpations: Abdomen is soft. There is no mass.     Tenderness: There is no abdominal tenderness. There is no right CVA tenderness, left CVA tenderness, guarding or rebound.  Musculoskeletal:        General: No swelling or tenderness. Normal range of motion.     Cervical back: Normal range of motion. No rigidity or tenderness.     Right lower leg: No edema.     Left lower leg: No edema.  Lymphadenopathy:     Cervical: No cervical adenopathy.  Skin:    General: Skin is warm and dry.     Coloration: Skin is not pale.     Findings: No bruising, erythema, lesion or rash.  Neurological:     Mental Status: She is alert and oriented to person, place, and time.     Cranial Nerves: No cranial nerve deficit.     Sensory: No sensory deficit.     Motor: No weakness.     Coordination: Coordination normal.     Gait: Gait normal.  Psychiatric:        Mood and Affect: Mood  normal.        Speech: Speech normal.        Behavior: Behavior normal.        Thought Content: Thought content normal.        Judgment: Judgment normal.     Labs reviewed: No results for input(s): "NA", "K", "CL", "CO2", "GLUCOSE", "BUN", "CREATININE", "CALCIUM", "MG", "PHOS" in the last 8760 hours. No results for input(s): "AST", "ALT", "ALKPHOS", "BILITOT", "PROT", "ALBUMIN" in the last 8760 hours. No results for input(s): "WBC", "NEUTROABS", "HGB", "HCT", "MCV", "PLT" in the last 8760 hours. Lab Results  Component Value Date   TSH 1.13 07/28/2021   No results found for: "HGBA1C" Lab Results  Component Value Date   CHOL 226 (H) 07/28/2021   HDL 62 07/28/2021   LDLCALC 149 (H) 07/28/2021   TRIG 57 07/28/2021   CHOLHDL 3.6 07/28/2021    Significant Diagnostic Results in last 30 days:  No results found.  Assessment/Plan 1. Obesity (BMI 35.0-39.9 without comorbidity) BMI 36.11  Would like to resume Phentermine  - dietary modification and exercise advised.consider refer to weight management if no improvement - PDMP reviewed.Signed non-narcotic use contract.  - phentermine 37.5 MG capsule; Take 1 capsule (37.5 mg total) by mouth every morning.  Dispense: 30 capsule; Refill: 2 - CBC with Differential/Platelet; Future - COMPLETE METABOLIC PANEL WITH GFR; Future - TSH; Future - TSH - COMPLETE METABOLIC PANEL WITH GFR - CBC with Differential/Platelet  2. Nasal congestion Continue on Flonase  - fluticasone (FLONASE) 50 MCG/ACT nasal spray; Place 2 sprays into both nostrils daily.  Dispense: 16 g; Refill: 0  3. Annual physical exam Up to date with immunization except COVID-19 vaccine aware to get vaccine at the pharmacy. Medication and labs reviewed patient counselled regarding yearly exam, prevention of dental and periodontal disease, diet, regular sustained exercise for at least 30 minutes x 3 /week. - CBC with Differential/Platelet; Future - COMPLETE METABOLIC PANEL WITH  GFR; Future - TSH; Future - TSH - COMPLETE METABOLIC PANEL WITH GFR - CBC  with Differential/Platelet  4. Flexural eczema Triamcinolone cream one application twice daily   5. Large breasts Request referral to plastic surgery for breast reduction.  - Ambulatory referral to Plastic Surgery  6. Neck pain, bilateral Reports due to large breast size  - continue OTC analgesics as needed  - Ambulatory referral to Plastic Surgery  7. Chronic left shoulder pain Reports due to large breast size  - continue OTC analgesics as needed  - Ambulatory referral to Plastic Surgery  8. Upper back pain Reports due to large breast size. - continue to use sports bra  - Ambulatory referral to Plastic Surgery  Family/ staff Communication: Reviewed plan of care with patient verbalized understanding   Labs/tests ordered:  - TSH; Future - TSH - COMPLETE METABOLIC PANEL WITH GFR - CBC with Differential/Platelet  Next Appointment :Return in about 1 year (around 10/22/2023) for annual Physical examination.    Caesar Bookman, NP

## 2022-10-23 ENCOUNTER — Other Ambulatory Visit: Payer: Medicaid Other

## 2022-10-23 DIAGNOSIS — Z0000 Encounter for general adult medical examination without abnormal findings: Secondary | ICD-10-CM | POA: Diagnosis not present

## 2022-10-23 DIAGNOSIS — E669 Obesity, unspecified: Secondary | ICD-10-CM | POA: Diagnosis not present

## 2022-10-23 LAB — CBC WITH DIFFERENTIAL/PLATELET
Basophils Absolute: 30 cells/uL (ref 0–200)
Hemoglobin: 12.1 g/dL (ref 11.7–15.5)
MCV: 85.8 fL (ref 80.0–100.0)
Platelets: 253 10*3/uL (ref 140–400)
Total Lymphocyte: 44.2 %

## 2022-10-24 LAB — COMPLETE METABOLIC PANEL WITH GFR
AG Ratio: 1.4 (calc) (ref 1.0–2.5)
ALT: 20 U/L (ref 6–29)
AST: 17 U/L (ref 10–30)
Albumin: 4.3 g/dL (ref 3.6–5.1)
Alkaline phosphatase (APISO): 52 U/L (ref 31–125)
BUN: 10 mg/dL (ref 7–25)
CO2: 26 mmol/L (ref 20–32)
Calcium: 9.4 mg/dL (ref 8.6–10.2)
Chloride: 106 mmol/L (ref 98–110)
Creat: 0.76 mg/dL (ref 0.50–0.97)
Globulin: 3 g/dL (calc) (ref 1.9–3.7)
Glucose, Bld: 97 mg/dL (ref 65–99)
Potassium: 3.9 mmol/L (ref 3.5–5.3)
Sodium: 141 mmol/L (ref 135–146)
Total Bilirubin: 0.4 mg/dL (ref 0.2–1.2)
Total Protein: 7.3 g/dL (ref 6.1–8.1)
eGFR: 103 mL/min/{1.73_m2} (ref >=60)

## 2022-10-24 LAB — CBC WITH DIFFERENTIAL/PLATELET
Absolute Monocytes: 230 cells/uL (ref 200–950)
Basophils Relative: 0.6 %
Eosinophils Absolute: 60 cells/uL (ref 15–500)
Eosinophils Relative: 1.2 %
HCT: 36.2 % (ref 35.0–45.0)
Lymphs Abs: 2210 cells/uL (ref 850–3900)
MCH: 28.7 pg (ref 27.0–33.0)
MCHC: 33.4 g/dL (ref 32.0–36.0)
MPV: 10 fL (ref 7.5–12.5)
Monocytes Relative: 4.6 %
Neutro Abs: 2470 cells/uL (ref 1500–7800)
Neutrophils Relative %: 49.4 %
RBC: 4.22 10*6/uL (ref 3.80–5.10)
RDW: 11.9 % (ref 11.0–15.0)
WBC: 5 10*3/uL (ref 3.8–10.8)

## 2022-10-24 LAB — TSH: TSH: 0.98 mIU/L

## 2022-10-28 NOTE — Progress Notes (Signed)
  This encounter was created in error - please disregard. No show 

## 2022-11-19 ENCOUNTER — Other Ambulatory Visit: Payer: Self-pay | Admitting: Family

## 2022-11-19 DIAGNOSIS — R0981 Nasal congestion: Secondary | ICD-10-CM

## 2022-12-03 ENCOUNTER — Ambulatory Visit: Payer: Medicaid Other | Admitting: Plastic Surgery

## 2022-12-03 ENCOUNTER — Ambulatory Visit: Payer: Medicaid Other | Admitting: Family

## 2022-12-03 ENCOUNTER — Encounter: Payer: Self-pay | Admitting: Family

## 2022-12-03 ENCOUNTER — Encounter: Payer: Self-pay | Admitting: Plastic Surgery

## 2022-12-03 VITALS — BP 132/91 | HR 85 | Ht 65.0 in | Wt 208.2 lb

## 2022-12-03 VITALS — BP 128/88 | HR 86 | Temp 96.7°F | Ht 65.0 in | Wt 210.0 lb

## 2022-12-03 DIAGNOSIS — N62 Hypertrophy of breast: Secondary | ICD-10-CM | POA: Diagnosis not present

## 2022-12-03 DIAGNOSIS — L2082 Flexural eczema: Secondary | ICD-10-CM

## 2022-12-03 DIAGNOSIS — M542 Cervicalgia: Secondary | ICD-10-CM

## 2022-12-03 DIAGNOSIS — K644 Residual hemorrhoidal skin tags: Secondary | ICD-10-CM

## 2022-12-03 DIAGNOSIS — M546 Pain in thoracic spine: Secondary | ICD-10-CM

## 2022-12-03 DIAGNOSIS — Z6834 Body mass index (BMI) 34.0-34.9, adult: Secondary | ICD-10-CM | POA: Diagnosis not present

## 2022-12-03 MED ORDER — TRIAMCINOLONE ACETONIDE 0.5 % EX OINT
1.0000 | TOPICAL_OINTMENT | Freq: Two times a day (BID) | CUTANEOUS | 0 refills | Status: DC
Start: 2022-12-03 — End: 2023-01-24

## 2022-12-03 MED ORDER — HYDROCORTISONE 0.5 % EX OINT
1.0000 | TOPICAL_OINTMENT | Freq: Two times a day (BID) | CUTANEOUS | 0 refills | Status: AC
Start: 2022-12-03 — End: ?

## 2022-12-03 NOTE — Progress Notes (Signed)
Referring Provider Yvonne Black, Yvonne Citrin, NP [address] [place],  [place] [address]   CC:  Chief Complaint  Patient presents with   Advice Only      Yvonne Black is an 37 y.o. female.  HPI: Yvonne Black is a 37 year old female who presents today with complaints of upper back and neck pain and difficulty finding bras that fit properly.  She also states that the size of her breasts interfere with her daily activities specifically activities revolving around her physical fitness activities.  She did undergo breast reduction in 2018 but has had an increase in the breast size since then.  Allergies  Allergen Reactions   Iodine Anaphylaxis   Latex Hives   Penicillins    Shellfish Allergy     Outpatient Encounter Medications as of 12/03/2022  Medication Sig   fluticasone (FLONASE) 50 MCG/ACT nasal spray Place 2 sprays into both nostrils daily.   phentermine 37.5 MG capsule Take 1 capsule (37.5 mg total) by mouth every morning.   triamcinolone cream (KENALOG) 0.1 % Apply 1 Application topically 2 (two) times daily.   No facility-administered encounter medications on file as of 12/03/2022.     Past Medical History:  Diagnosis Date   Hormone disorder     Past Surgical History:  Procedure Laterality Date   BREAST SURGERY  12/19/2016   Reduction   CESAREAN SECTION  05/03/2010   CYST REMOVAL HAND     tummy tuck  12/19/2016    Family History  Problem Relation Age of Onset   Breast cancer Other 50   Diabetes Maternal Grandmother    Hypertension Maternal Grandmother    Diabetes Maternal Grandfather    Hypertension Maternal Grandfather    Healthy Mother    Healthy Father    Ovarian cancer Neg Hx    Colon cancer Neg Hx     Social History   Social History Narrative   Tobacco use, amount per day now: No   Past tobacco use, amount per day: No   How many years did you use tobacco: No   Alcohol use (drinks per week): Socially not weekly.   Diet:   Do you drink/eat things with  caffeine: Yes   Marital status:   Single                               What year were you married?   Do you live in a house, apartment, assisted living, condo, trailer, etc.? Rental   Is it one or more stories? 1   How many persons live in your home? 3   Do you have pets in your home?( please list) 0   Highest Level of education completed? Masters    Current or past profession: Firefighter   Do you exercise?  Light                                Type and how often? 2-3 times weekly.   Do you have a living will? Yes   Do you have a DNR form?      Yes                              If not, do you want to discuss one?   Do you have signed POA/HPOA forms?  No                If so, please bring to you appointment      Do you have any difficulty bathing or dressing yourself? No   Do you have any difficulty preparing food or eating? No   Do you have any difficulty managing your medications? No   Do you have any difficulty managing your finances? No   Do you have any difficulty affording your medications? No     Review of Systems General: Denies fevers, chills, weight loss CV: Denies chest pain, shortness of breath, palpitations Breast: Patient has large breasts which she feels is contributing to her upper back and neck pain.  She denies any other specific breast problems.  Physical Exam    12/03/2022   11:02 AM 10/22/2022    3:19 PM 08/24/2021    3:07 PM  Vitals with BMI  Height 5\' 5"  5\' 5"  5\' 5"   Weight 208 lbs 3 oz 217 lbs 204 lbs 6 oz  BMI 34.65 36.11 34.01  Systolic 132 130 096  Diastolic 91 86 82  Pulse 85 84 71    General:  No acute distress,  Alert and oriented, Non-Toxic, Normal speech and affect Breast: Patient has moderately large breasts with grade 1 ptosis.  She has well-healed periareolar vertical and inframammary incisions.  On exam she has no dominant masses and the nipples are in appearance without evidence of nipple discharge.  Sternal notch to nipple distance is 28  on the right 30 cm on the left.  Fold to nipple distance on the right is 19 cm and 14 cm Mammogram: Not applicable due to age Assessment/Plan Macromastia: Patient has previously undergone a bilateral breast reduction has had an increase in the breast size.  She complains of upper back and neck pain and difficulty with daily activities.  She is requesting a second reduction.  On physical exam I believe that I can remove 600 g per breast.  We discussed breast reductions including the fact that she will have incisions the same places that she currently has her previous scars.  We discussed the risks of bleeding, infection, seroma formation.  She understands that I will use drains which will stay in overnight.  We discussed the possibility of nipple loss due to nipple ischemia.  She understands that she may be at a slightly higher risk due to the fact that she has had a breast reduction already.  She understands that to the meds for a breast reduction and she may end up with a reasonably small breasts though I cannot tell her exactly what cup size this would be.  We discussed the postoperative restrictions including no heavy lifting, no vigorous activity, and no submerging incisions in water.  She will be able to return to light activity as tolerated.  Encouraged her to ambulate as soon as possible after surgery to help decrease the risk of DVT.  All questions were answered to her satisfaction.  Photographs were obtained with her consent.  Will submit for a bilateral breast reduction at her request.  Yvonne Black 12/03/2022, 11:44 AM

## 2022-12-03 NOTE — Progress Notes (Signed)
Provider: Richarda Blade FNP-C  Derell Bruun, Donalee Citrin, NP  Patient Care Team: Delora Gravatt, Donalee Citrin, NP as PCP - General (Family Medicine)  Extended Emergency Contact Information Primary Emergency Contact: [facility] Phone: [phone] Relation: Mother  Code Status: Full Code Goals of care: Advanced Directive information    07/28/2021    1:29 PM  Advanced Directives  Does Patient Have a Medical Advance Directive? No  Would patient like information on creating a medical advance directive? No - Patient declined     Chief Complaint  Patient presents with   Acute Visit    Patient presents today for possible hemorrhoid.    HPI:  Pt is a 37 y.o. female seen today for an acute visit for evaluation of hemorrhoids.states had painful hemorrhoids recently when she made appointment to be seen.Had episode of constipation but usually bowels move without any difficulties. She denies any bleeding.      Past Medical History:  Diagnosis Date   Hormone disorder    Past Surgical History:  Procedure Laterality Date   BREAST SURGERY  12/19/2016   Reduction   CESAREAN SECTION  05/03/2010   CYST REMOVAL HAND     tummy tuck  12/19/2016    Allergies  Allergen Reactions   Iodine Anaphylaxis   Latex Hives   Penicillins    Shellfish Allergy     Outpatient Encounter Medications as of 12/03/2022  Medication Sig   fluticasone (FLONASE) 50 MCG/ACT nasal spray Place 2 sprays into both nostrils daily.   hydrocortisone ointment 0.5 % Apply 1 Application topically 2 (two) times daily.   phentermine 37.5 MG capsule Take 1 capsule (37.5 mg total) by mouth every morning.   triamcinolone ointment (KENALOG) 0.5 % Apply 1 Application topically 2 (two) times daily.   [DISCONTINUED] triamcinolone cream (KENALOG) 0.1 % Apply 1 Application topically 2 (two) times daily.   No facility-administered encounter medications on file as of 12/03/2022.    Review of Systems  Constitutional:  Negative for  appetite change, chills, fatigue, fever and unexpected weight change.  Respiratory:  Negative for cough, chest tightness, shortness of breath and wheezing.   Cardiovascular:  Negative for chest pain, palpitations and leg swelling.  Gastrointestinal:  Negative for abdominal distention, abdominal pain, blood in stool, constipation, diarrhea, nausea and vomiting.       Episode of constipation with painful hemorrhoids   Genitourinary:  Negative for difficulty urinating, dysuria, flank pain, frequency and urgency.  Skin:  Negative for color change, pallor and wound.       Ezecema     Immunization History  Administered Date(s) Administered   Influenza-Unspecified 03/30/2010   PFIZER(Purple Top)SARS-COV-2 Vaccination 02/14/2020, 03/07/2020   Tdap 05/04/2010, 10/09/2019   Pertinent  Health Maintenance Due  Topic Date Due   PAP SMEAR-Modifier  08/15/2024   INFLUENZA VACCINE  Discontinued      04/08/2018    8:13 AM 06/22/2021    1:03 PM 07/28/2021    1:29 PM  Fall Risk  Falls in the past year?   0  Was there an injury with Fall?   0  Fall Risk Category Calculator   0  Fall Risk Category (Retired)   Low  (RETIRED) Patient Fall Risk Level Low fall risk Low fall risk Low fall risk  Patient at Risk for Falls Due to   No Fall Risks  Fall risk Follow up   Falls evaluation completed   Functional Status Survey:    Vitals:   12/03/22 1327  BP: 128/88  Pulse: 86  Temp: (!) 96.7 F (35.9 C)  SpO2: 97%  Weight: 210 lb (95.3 kg)  Height: 5\' 5"  (1.651 m)   Body mass index is 34.95 kg/m. Physical Exam Vitals reviewed. Exam conducted with a chaperone present [facility] Dillard,CMA).  Constitutional:      General: She is not in acute distress.    Appearance: Normal appearance. She is normal weight. She is not ill-appearing or diaphoretic.  Neck:     Vascular: No carotid bruit.  Cardiovascular:     Rate and Rhythm: Normal rate and regular rhythm.     Pulses: Normal pulses.     Heart  sounds: Normal heart sounds. No murmur heard.    No friction rub. No gallop.  Pulmonary:     Effort: Pulmonary effort is normal. No respiratory distress.     Breath sounds: Normal breath sounds. No wheezing, rhonchi or rales.  Chest:     Chest wall: No tenderness.  Abdominal:     General: Bowel sounds are normal. There is no distension.     Palpations: Abdomen is soft. There is no mass.     Tenderness: There is no abdominal tenderness. There is no right CVA tenderness, left CVA tenderness, guarding or rebound.  Genitourinary:    Exam position: Knee-chest position.     Rectum: Tenderness and external hemorrhoid present. No mass, anal fissure or internal hemorrhoid. Normal anal tone.  Musculoskeletal:     Cervical back: Normal range of motion. No rigidity or tenderness.  Lymphadenopathy:     Cervical: No cervical adenopathy.  Skin:    General: Skin is warm and dry.     Coloration: Skin is not pale.     Findings: No bruising, erythema, lesion or rash.  Neurological:     Mental Status: She is alert.  Psychiatric:        Mood and Affect: Mood normal.        Speech: Speech normal.        Behavior: Behavior normal.     Labs reviewed: Recent Labs    10/23/22 1032  NA 141  K 3.9  CL 106  CO2 26  GLUCOSE 97  BUN 10  CREATININE 0.76  CALCIUM 9.4   Recent Labs    10/23/22 1032  AST 17  ALT 20  BILITOT 0.4  PROT 7.3   Recent Labs    10/23/22 1032  WBC 5.0  NEUTROABS 2,470  HGB 12.1  HCT 36.2  MCV 85.8  PLT 253   Lab Results  Component Value Date   TSH 0.98 10/23/2022   No results found for: "HGBA1C" Lab Results  Component Value Date   CHOL 226 (H) 07/28/2021   HDL 62 07/28/2021   LDLCALC 149 (H) 07/28/2021   TRIG 57 07/28/2021   CHOLHDL 3.6 07/28/2021    Significant Diagnostic Results in last 30 days:  No results found.  Assessment/Plan 1. External hemorrhoid External hemorrhoids without any bleeding or fissure.Chaprone Jasmine Dillard,CMA present  through out the exam. Tolerated exam well. - hydrocortisone ointment 0.5 %; Apply 1 Application topically 2 (two) times daily.  Dispense: 30 g; Refill: 0  2. Flexural eczema Previous triamcinolone ineffective will increase to 0.5 % refer to dermatologist if still ineffective  - triamcinolone ointment (KENALOG) 0.5 %; Apply 1 Application topically 2 (two) times daily.  Dispense: 30 g; Refill: 0  Family/ staff Communication: Reviewed plan of care with patient verbalized understanding   Labs/tests ordered: None   Next Appointment: Return if  symptoms worsen or fail to improve.   Caesar Bookman, NP

## 2022-12-03 NOTE — Patient Instructions (Signed)
Hemorrhoids Hemorrhoids are swollen veins that may form: In the butt (rectum). These are called internal hemorrhoids. Around the opening of the butt (anus). These are called external hemorrhoids. Most hemorrhoids do not cause very bad problems. They often get better with changes to your lifestyle and what you eat. What are the causes? Having trouble pooping (constipation) or watery poop (diarrhea). Pushing too hard when you poop. Pregnancy. Being very overweight (obese). Sitting for too long. Riding a bike for a long time. Heavy lifting or other things that take a lot of effort. Anal sex. What are the signs or symptoms? Pain. Itching or soreness in the butt. Bleeding from the butt. Leaking poop. Swelling. One or more lumps around the opening of your butt. How is this treated? In most cases, hemorrhoids can be treated at home. You may be told to: Change what you eat. Make changes to your lifestyle. If these treatments do not help, you may need to have a procedure done. Your doctor may need to: Place rubber bands at the bottom of the hemorrhoids to make them fall off. Put medicine into the hemorrhoids to shrink them. Shine a type of light on the hemorrhoids to cause them to fall off. Do surgery to get rid of the hemorrhoids. Follow these instructions at home: Medicines Take over-the-counter and prescription medicines only as told by your doctor. Use creams with medicine in them or medicines that you put in your butt as told by your doctor. Eating and drinking  Eat foods that have a lot of fiber in them. These include whole grains, beans, nuts, fruits, and vegetables. Ask your doctor about taking products that have fiber added to them (fibersupplements). Take in less fat. You can do this by: Eating low-fat dairy products. Eating less red meat. Staying away from processed foods. Drink enough fluid to keep your pee (urine) pale yellow. Managing pain and swelling  Take a  warm-water bath (sitz bath) for 20 minutes to ease pain. Do this 3-4 times a day. You may do this in a bathtub. You may also use a portable sitz bath that fits over the toilet. If told, put ice on the painful area. It may help to use ice between your warm baths. Put ice in a plastic bag. Place a towel between your skin and the bag. Leave the ice on for 20 minutes, 2-3 times a day. If your skin turns bright red, take off the ice right away to prevent skin damage. The risk of damage is higher if you cannot feel pain, heat, or cold. General instructions Exercise. Ask your doctor how much and what kind of exercise is best for you. Go to the bathroom when you need to poop. Do not wait. Try not to push too hard when you poop. Keep your butt dry and clean. Use wet toilet paper or moist towelettes after you poop. Do not sit on the toilet for a long time. Contact a doctor if: You have pain and swelling that do not get better with treatment. You have trouble pooping. You cannot poop. You have pain or swelling outside the area of the hemorrhoids. Get help right away if: You have bleeding from the butt that will not stop. This information is not intended to replace advice given to you by your health care provider. Make sure you discuss any questions you have with your health care provider. Document Revised: 03/14/2022 Document Reviewed: 03/14/2022 Elsevier Patient Education  2023 Elsevier Inc.  

## 2022-12-13 ENCOUNTER — Other Ambulatory Visit: Payer: Self-pay | Admitting: Family

## 2022-12-13 DIAGNOSIS — R0981 Nasal congestion: Secondary | ICD-10-CM

## 2022-12-19 ENCOUNTER — Other Ambulatory Visit: Payer: Self-pay | Admitting: Family

## 2022-12-19 DIAGNOSIS — E669 Obesity, unspecified: Secondary | ICD-10-CM

## 2022-12-19 MED ORDER — WEGOVY 0.25 MG/0.5ML ~~LOC~~ SOAJ
0.2500 mg | SUBCUTANEOUS | 0 refills | Status: DC
Start: 2022-12-19 — End: 2023-01-24

## 2022-12-19 NOTE — Progress Notes (Signed)
[facility] prescription send to pharmacy.

## 2022-12-21 ENCOUNTER — Telehealth: Payer: Self-pay

## 2022-12-21 NOTE — Telephone Encounter (Signed)
Please notify patient PA was denied.will need to follow up with weight management.

## 2022-12-21 NOTE — Telephone Encounter (Signed)
PA reqest received for Wegovy 0.25mg /0.69ml.  PA initiated through covermymeds and awaiting reply

## 2022-12-21 NOTE — Telephone Encounter (Signed)
Wegovy was denied. Denial letter under media tab.  Message sent to Richarda Blade, NP

## 2022-12-26 NOTE — Telephone Encounter (Signed)
Mychart message sent to patient.

## 2023-01-24 ENCOUNTER — Other Ambulatory Visit (HOSPITAL_COMMUNITY): Payer: Self-pay

## 2023-01-24 ENCOUNTER — Other Ambulatory Visit: Payer: Self-pay

## 2023-01-24 DIAGNOSIS — E669 Obesity, unspecified: Secondary | ICD-10-CM

## 2023-01-24 DIAGNOSIS — R0981 Nasal congestion: Secondary | ICD-10-CM

## 2023-01-24 DIAGNOSIS — L2082 Flexural eczema: Secondary | ICD-10-CM

## 2023-01-24 MED ORDER — WEGOVY 0.25 MG/0.5ML ~~LOC~~ SOAJ
0.2500 mg | SUBCUTANEOUS | 1 refills | Status: AC
Start: 2023-01-24 — End: ?
  Filled 2023-01-24: qty 2, 28d supply, fill #0

## 2023-01-24 MED ORDER — TRIAMCINOLONE ACETONIDE 0.5 % EX OINT
1.0000 | TOPICAL_OINTMENT | Freq: Two times a day (BID) | CUTANEOUS | 1 refills | Status: AC
Start: 2023-01-24 — End: ?
  Filled 2023-01-24 – 2023-05-08 (×3): qty 30, 15d supply, fill #0

## 2023-01-24 MED ORDER — FLUTICASONE PROPIONATE 50 MCG/ACT NA SUSP
2.0000 | Freq: Every day | NASAL | 1 refills | Status: AC
Start: 2023-01-24 — End: ?
  Filled 2023-01-24 – 2023-05-08 (×3): qty 16, 30d supply, fill #0

## 2023-01-31 ENCOUNTER — Telehealth: Payer: Self-pay | Admitting: Plastic Surgery

## 2023-01-31 NOTE — Telephone Encounter (Signed)
Spoke to patient about insurance denial let her know that [employer] denied her breast bilateral breast reduction due to her not having cancer risk or trauma

## 2023-03-25 ENCOUNTER — Other Ambulatory Visit (HOSPITAL_COMMUNITY): Payer: Self-pay

## 2023-04-04 ENCOUNTER — Other Ambulatory Visit (HOSPITAL_COMMUNITY): Payer: Self-pay

## 2023-05-05 ENCOUNTER — Encounter (INDEPENDENT_AMBULATORY_CARE_PROVIDER_SITE_OTHER): Payer: Self-pay

## 2023-05-08 ENCOUNTER — Other Ambulatory Visit (HOSPITAL_COMMUNITY): Payer: Self-pay

## 2023-05-08 ENCOUNTER — Other Ambulatory Visit: Payer: Self-pay

## 2023-05-09 ENCOUNTER — Other Ambulatory Visit: Payer: Self-pay

## 2023-05-09 DIAGNOSIS — Z1231 Encounter for screening mammogram for malignant neoplasm of breast: Secondary | ICD-10-CM

## 2023-05-10 ENCOUNTER — Telehealth (INDEPENDENT_AMBULATORY_CARE_PROVIDER_SITE_OTHER): Payer: Medicaid Other | Admitting: Family

## 2023-05-10 DIAGNOSIS — R0683 Snoring: Secondary | ICD-10-CM

## 2023-05-10 DIAGNOSIS — L2082 Flexural eczema: Secondary | ICD-10-CM

## 2023-05-10 DIAGNOSIS — R21 Rash and other nonspecific skin eruption: Secondary | ICD-10-CM | POA: Diagnosis not present

## 2023-05-10 DIAGNOSIS — E559 Vitamin D deficiency, unspecified: Secondary | ICD-10-CM | POA: Diagnosis not present

## 2023-05-10 MED ORDER — VITAMIN D (ERGOCALCIFEROL) 1.25 MG (50000 UNIT) PO CAPS
50000.0000 [IU] | ORAL_CAPSULE | ORAL | 3 refills | Status: AC
Start: 2023-05-10 — End: ?

## 2023-05-10 NOTE — Progress Notes (Signed)
This service is provided via telemedicine  No vital signs collected/recorded due to the encounter was a telemedicine visit.   Location of patient (ex: home, work):  Home  Patient consents to a telephone visit:    Location of the provider (ex: office, home):  Office  Name of any referring provider:  Kristain Hu C, NP   Names of all persons participating in the telemedicine service and their role in the encounter:  Yvonne Black (patient); Porsha McClurkin,CMA; Paylin Hailu,NP  Time spent on call:  6 minutes     Provider: Bostyn Kunkler FNP-C  Sriya Kroeze, Donalee Citrin, NP  Patient Care Team: Malon Siddall, Donalee Citrin, NP as PCP - General (Family Medicine)  Extended Emergency Contact Information Primary Emergency Contact: [facility] Phone: [phone] Relation: Mother  Code Status:  Full Code  Goals of care: Advanced Directive information    07/28/2021    1:29 PM  Advanced Directives  Does Patient Have a Medical Advance Directive? No  Would patient like information on creating a medical advance directive? No - Patient declined     Chief Complaint  Patient presents with   Acute Visit    Patient presents today for dermatology and sleep study referral.     HPI:  Pt is a 37 y.o. female seen today for an acute visit for evaluation of snoring.states previously referred for sleep study but practice did not have a home sleep study that she wanted.states has moved to [place] area in [place] would like another referral sent to [employer]. She would also like referral to dermatologist in [facility] for breakout on her chin/neck area.states has never had it before but seems to be worsening.    Past Medical History:  Diagnosis Date   Hormone disorder    Past Surgical History:  Procedure Laterality Date   BREAST SURGERY  12/19/2016   Reduction   CESAREAN SECTION  05/03/2010   CYST REMOVAL HAND     tummy tuck  12/19/2016    Allergies  Allergen Reactions   Iodine  Anaphylaxis   Latex Hives   Penicillins    Shellfish Allergy     Outpatient Encounter Medications as of 05/10/2023  Medication Sig   fluticasone (FLONASE) 50 MCG/ACT nasal spray Place 2 sprays into both nostrils daily.   hydrocortisone ointment 0.5 % Apply 1 Application topically 2 (two) times daily.   phentermine 37.5 MG capsule Take 1 capsule (37.5 mg total) by mouth every morning.   Semaglutide-Weight Management (WEGOVY) 0.25 MG/0.5ML SOAJ Inject 0.25 mg into the skin once a week.   triamcinolone ointment (KENALOG) 0.5 % Apply 1 Application topically 2 (two) times daily.   Vitamin D, Ergocalciferol, (DRISDOL) 1.25 MG (50000 UNIT) CAPS capsule Take 1 capsule (50,000 Units total) by mouth every 7 (seven) days.   No facility-administered encounter medications on file as of 05/10/2023.    Review of Systems  Constitutional:  Negative for appetite change, chills, fatigue, fever and unexpected weight change.  Respiratory:  Negative for cough, chest tightness, shortness of breath and wheezing.        Snoring   Cardiovascular:  Negative for chest pain, palpitations and leg swelling.  Skin:  Positive for rash. Negative for color change, pallor and wound.       Claudie Fisherman /neck  Neurological:  Negative for dizziness, weakness, light-headedness and headaches.    Immunization History  Administered Date(s) Administered   Influenza-Unspecified 03/30/2010   PFIZER(Purple Top)SARS-COV-2 Vaccination 02/14/2020, 03/07/2020   Tdap 05/04/2010, 10/09/2019   Pertinent  Health Maintenance Due  Topic Date Due   INFLUENZA VACCINE  Discontinued      04/08/2018    8:13 AM 06/22/2021    1:03 PM 07/28/2021    1:29 PM  Fall Risk  Falls in the past year?   0  Was there an injury with Fall?   0  Fall Risk Category Calculator   0  Fall Risk Category (Retired)   Low  (RETIRED) Patient Fall Risk Level Low fall risk Low fall risk Low fall risk  Patient at Risk for Falls Due to   No Fall Risks  Fall risk  Follow up   Falls evaluation completed   Functional Status Survey:    There were no vitals filed for this visit. There is no height or weight on file to calculate BMI. Physical Exam Constitutional:      General: She is not in acute distress.    Appearance: She is not ill-appearing.  Pulmonary:     Effort: Pulmonary effort is normal. No respiratory distress.  Skin:    Comments: Rash on chin/neck area not visible on video   Neurological:     Mental Status: She is alert and oriented to person, place, and time.     Gait: Gait normal.  Psychiatric:        Mood and Affect: Mood normal.        Behavior: Behavior normal.     Labs reviewed: Recent Labs    10/23/22 1032  NA 141  K 3.9  CL 106  CO2 26  GLUCOSE 97  BUN 10  CREATININE 0.76  CALCIUM 9.4   Recent Labs    10/23/22 1032  AST 17  ALT 20  BILITOT 0.4  PROT 7.3   Recent Labs    10/23/22 1032  WBC 5.0  NEUTROABS 2,470  HGB 12.1  HCT 36.2  MCV 85.8  PLT 253   Lab Results  Component Value Date   TSH 0.98 10/23/2022   No results found for: "HGBA1C" Lab Results  Component Value Date   CHOL 226 (H) 07/28/2021   HDL 62 07/28/2021   LDLCALC 149 (H) 07/28/2021   TRIG 57 07/28/2021   CHOLHDL 3.6 07/28/2021    Significant Diagnostic Results in last 30 days:  No results found.  Assessment/Plan 1. Flexural eczema Worsening  - continue on Triamcinolone  - Ambulatory referral to Dermatology  2. Snoring Family report snores when sleeping  Would like to do home sleep study verse outpatient. - Ambulatory referral to Sleep Studies  3. Vitamin D deficiency Continue on vit D supplement  - Vitamin D, Ergocalciferol, (DRISDOL) 1.25 MG (50000 UNIT) CAPS capsule; Take 1 capsule (50,000 Units total) by mouth every 7 (seven) days.  Dispense: 5 capsule; Refill: 3  4. Rash and nonspecific skin eruption Unclear etiology has been on and off.  - Ambulatory referral to Dermatology  Family/ staff Communication:  Reviewed plan of care with patient verbalized understanding  Labs/tests ordered: None   Next Appointment: Return if symptoms worsen or fail to improve.  I connected with  Alanea Woolridge on 05/10/23 by a video enabled telemedicine application and verified that I am speaking with the correct person using two identifiers.   I discussed the limitations of evaluation and management by telemedicine. The patient expressed understanding and agreed to proceed.  Spent 12 minutes of face to face with patient  >50% time spent counseling; reviewing medical record; labs and developing future plan of care.   Miata Culbreth C  Jonh Mcqueary, NP

## 2023-05-14 ENCOUNTER — Other Ambulatory Visit: Payer: Self-pay

## 2023-05-19 ENCOUNTER — Encounter: Payer: Self-pay | Admitting: Family

## 2023-10-11 ENCOUNTER — Telehealth

## 2023-10-11 DIAGNOSIS — L309 Dermatitis, unspecified: Secondary | ICD-10-CM

## 2023-10-11 DIAGNOSIS — Z760 Encounter for issue of repeat prescription: Secondary | ICD-10-CM

## 2023-10-11 MED ORDER — FLUTICASONE PROPIONATE 50 MCG/ACT NA SUSP: 2.0000 | Freq: Every day | NASAL | 0 refills | Status: AC

## 2023-10-11 MED ORDER — TRIAMCINOLONE ACETONIDE 0.1 % EX CREA: 1.0000 | TOPICAL_CREAM | Freq: Two times a day (BID) | CUTANEOUS | 0 refills | Status: DC

## 2023-10-11 MED ORDER — VITAMIN D (ERGOCALCIFEROL) 1.25 MG (50000 UNIT) PO CAPS: 50000.0000 [IU] | ORAL_CAPSULE | ORAL | 0 refills | Status: AC

## 2023-10-11 NOTE — Addendum Note (Signed)
 Addended by: [name] on: 10/11/2023 09:40 AM   Modules accepted: Level of Service

## 2023-10-11 NOTE — Patient Instructions (Signed)
  [name], thank you for joining [name], PA-C for today's virtual visit.  While this provider is not your primary care provider (PCP), if your PCP is located in our provider database this encounter information will be shared with them immediately following your visit.   A [facility] MyChart account gives you access to today's visit and all your visits, tests, and labs performed at [facility] " click here if you don't have a [facility] MyChart account or go to [email or web address]  Consent: (Patient) Yvonne Black provided verbal consent for this virtual visit at the beginning of the encounter.  Current Medications:  Current Outpatient Medications:    fluticasone (FLONASE) 50 MCG/ACT nasal spray, Place 2 sprays into both nostrils daily., Disp: 16 g, Rfl: 1   hydrocortisone ointment 0.5 %, Apply 1 Application topically 2 (two) times daily., Disp: 30 g, Rfl: 0   phentermine 37.5 MG capsule, Take 1 capsule (37.5 mg total) by mouth every morning., Disp: 30 capsule, Rfl: 2   Semaglutide-Weight Management (WEGOVY) 0.25 MG/0.5ML SOAJ, Inject 0.25 mg into the skin once a week., Disp: 2 mL, Rfl: 1   triamcinolone ointment (KENALOG) 0.5 %, Apply 1 Application topically 2 (two) times daily., Disp: 30 g, Rfl: 1   Vitamin D, Ergocalciferol, (DRISDOL) 1.25 MG (50000 UNIT) CAPS capsule, Take 1 capsule (50,000 Units total) by mouth every 7 (seven) days., Disp: 5 capsule, Rfl: 3   Medications ordered in this encounter:  No orders of the defined types were placed in this encounter.    *If you need refills on other medications prior to your next appointment, please contact your pharmacy*  Follow-Up: Call back or seek an in-person evaluation if the symptoms worsen or if the condition fails to improve as anticipated.  [facility] [phone]  Other Instructions Please use the medication as directed. Continue using eucerin lotion as well.   Follow up with your  regular doctor in 1 week for reassessment and seek care sooner if your symptoms worsen or fail to improve.    If you have been instructed to have an in-person evaluation today at a local Urgent Care facility, please use the link below. It will take you to a list of all of our available [facility], including address, phone number and hours of operation. Please do not delay care.  [facility]  If you or a family member do not have a primary care provider, use the link below to schedule a visit and establish care. When you choose a [facility] primary care physician or advanced practice provider, you gain a long-term partner in health. Find a Primary Care Provider  Learn more about [facility]'s in-office and virtual care options: [facility] - Get Care Now

## 2023-10-11 NOTE — Progress Notes (Signed)
 Ms. Yvonne, Black are scheduled for a virtual visit with your provider today.    Just as we do with appointments in the office, we must obtain your consent to participate.  Your consent will be active for this visit and any virtual visit you may have with one of our providers in the next 365 days.    If you have a MyChart account, I can also send a copy of this consent to you electronically.  All virtual visits are billed to your insurance company just like a traditional visit in the office.  As this is a virtual visit, video technology does not allow for your provider to perform a traditional examination.  This may limit your provider's ability to fully assess your condition.  If your provider identifies any concerns that need to be evaluated in person or the need to arrange testing such as labs, EKG, etc, we will make arrangements to do so.    Although advances in technology are sophisticated, we cannot ensure that it will always work on either your end or our end.  If the connection with a video visit is poor, we may have to switch to a telephone visit.  With either a video or telephone visit, we are not always able to ensure that we have a secure connection.   I need to obtain your verbal consent now.   Are you willing to proceed with your visit today?   Yvonne Black has provided verbal consent on 10/11/2023 for a virtual visit (video or telephone).   Yvonne Meres, PA-C 10/11/2023  8:16 AM   Date:  10/11/2023   ID:  Yvonne Black, DOB 02-04-1986, MRN [id number]  Patient Location: Home Provider Location: Home Office   Participants: Patient and Provider for Visit and Wrap up  Method of visit: Video  Location of Patient: Home Location of Provider: Home Office Consent was obtain for visit over the video. Services rendered by provider: Visit was performed via video  A video enabled telemedicine application was used and I verified that I am speaking with the correct person using two  identifiers.  PCP:  Ngetich, Yvonne Citrin, NP   Chief Complaint:  eczema  History of Present Illness:    Yvonne Black is a 38 y.o. female with history as stated below. Presents video telehealth for an acute care visit  Pt reports that she has a h/o eczema and she feels that she has a patch of this on her breast. Pt states she has previously used topical medications for her eczema but she recently moved and has been unable to fill her prescriptions.   She denies that she has pain to the area and has had no fevers. The area is not red. She denies fluctuance to the area.   She is also requesting medication refills of her vitamin D and fluticasone.  Past Medical, Surgical, Social History, Allergies, and Medications have been Reviewed.  Past Medical History:  Diagnosis Date   Hormone disorder     No outpatient medications have been marked as taking for the 10/11/23 encounter (Appointment) with [facility] PROVIDER.     Allergies:   Iodine, Latex, Penicillins, and Shellfish allergy   ROS See HPI for history of present illness.  Physical Exam Skin:    Comments: 1cm area of eczema noted to the 6 o clock position of the left areola, no erythema, induration, or TTP on self exam  Neurological:     Mental Status: She is alert.  MDM: Pt with eczema to the left breast. No signs of infection/abscess on exam. Will send kenalog cream. Also requesting refill of prior meds, rx sent.   Tests Ordered: No orders of the defined types were placed in this encounter.   Medication Changes: No orders of the defined types were placed in this encounter.    Disposition:  Follow up  Signed, Yvonne Elms Manfred Shirts, PA-C  10/11/2023 8:16 AM

## 2023-10-24 ENCOUNTER — Encounter: Payer: Medicaid Other | Admitting: Family

## 2023-10-27 NOTE — Progress Notes (Signed)
   This encounter was created in error - please disregard. No show

## 2023-11-13 ENCOUNTER — Telehealth

## 2023-11-13 ENCOUNTER — Telehealth: Admitting: Physician Assistant

## 2023-11-13 DIAGNOSIS — K047 Periapical abscess without sinus: Secondary | ICD-10-CM | POA: Diagnosis not present

## 2023-11-13 MED ORDER — CLINDAMYCIN HCL 300 MG PO CAPS
300.0000 mg | ORAL_CAPSULE | Freq: Three times a day (TID) | ORAL | 0 refills | Status: AC
Start: 2023-11-13 — End: 2023-11-20

## 2023-11-13 MED ORDER — NAPROXEN 500 MG PO TABS: 500.0000 mg | ORAL_TABLET | Freq: Two times a day (BID) | ORAL | 0 refills | Status: DC

## 2023-11-13 NOTE — Patient Instructions (Signed)
  Georgean Kindle, thank you for joining Hyla Maillard, PA-C for today's virtual visit.  While this provider is not your primary care provider (PCP), if your PCP is located in our provider database this encounter information will be shared with them immediately following your visit.   A [facility] MyChart account gives you access to today's visit and all your visits, tests, and labs performed at [facility] " click here if you don't have a [facility] MyChart account or go to [email or web address]  Consent: (Patient) Zilla Klundt provided verbal consent for this virtual visit at the beginning of the encounter.  Current Medications:  Current Outpatient Medications:    fluticasone  (FLONASE ) 50 MCG/ACT nasal spray, Place 2 sprays into both nostrils daily., Disp: 16 g, Rfl: 1   fluticasone  (FLONASE ) 50 MCG/ACT nasal spray, Place 2 sprays into both nostrils daily., Disp: 16 g, Rfl: 0   hydrocortisone  ointment 0.5 %, Apply 1 Application topically 2 (two) times daily., Disp: 30 g, Rfl: 0   phentermine  37.5 MG capsule, Take 1 capsule (37.5 mg total) by mouth every morning., Disp: 30 capsule, Rfl: 2   Semaglutide -Weight Management (WEGOVY ) 0.25 MG/0.5ML SOAJ, Inject 0.25 mg into the skin once a week., Disp: 2 mL, Rfl: 1   triamcinolone  cream (KENALOG ) 0.1 %, Apply 1 Application topically 2 (two) times daily., Disp: 15 g, Rfl: 0   triamcinolone  ointment (KENALOG ) 0.5 %, Apply 1 Application topically 2 (two) times daily., Disp: 30 g, Rfl: 1   Vitamin D , Ergocalciferol , (DRISDOL ) 1.25 MG (50000 UNIT) CAPS capsule, Take 1 capsule (50,000 Units total) by mouth every 7 (seven) days., Disp: 5 capsule, Rfl: 3   Vitamin D , Ergocalciferol , (DRISDOL ) 1.25 MG (50000 UNIT) CAPS capsule, Take 1 capsule (50,000 Units total) by mouth every 7 (seven) days., Disp: 5 capsule, Rfl: 0   Medications ordered in this encounter:  No orders of the defined types were placed in this encounter.    *If you need  refills on other medications prior to your next appointment, please contact your pharmacy*  Follow-Up: Call back or seek an in-person evaluation if the symptoms worsen or if the condition fails to improve as anticipated.  [facility] [phone]  Other Instructions Please take the antibiotic as directed. Tylenol OTC. Take the prescription anti-inflammatory as directed.  Use clove paste as we discussed. If unable to get in with dental provider in next 10 days, you need follow-up with your PCP or to be seen at local urgent care.  ER for any acutely worsening symptoms despite treatment.   If you have been instructed to have an in-person evaluation today at a local Urgent Care facility, please use the link below. It will take you to a list of all of our available [facility], including address, phone number and hours of operation. Please do not delay care.  [facility]  If you or a family member do not have a primary care provider, use the link below to schedule a visit and establish care. When you choose a [facility] primary care physician or advanced practice provider, you gain a long-term partner in health. Find a Primary Care Provider  Learn more about [facility]'s in-office and virtual care options: [facility] - Get Care Now

## 2023-11-13 NOTE — Progress Notes (Signed)
 Virtual Visit Consent   Yvonne Black, you are scheduled for a virtual visit with a [facility] provider today. Just as with appointments in the office, your consent must be obtained to participate. Your consent will be active for this visit and any virtual visit you may have with one of our providers in the next 365 days. If you have a MyChart account, a copy of this consent can be sent to you electronically.  As this is a virtual visit, video technology does not allow for your provider to perform a traditional examination. This may limit your provider's ability to fully assess your condition. If your provider identifies any concerns that need to be evaluated in person or the need to arrange testing (such as labs, EKG, etc.), we will make arrangements to do so. Although advances in technology are sophisticated, we cannot ensure that it will always work on either your end or our end. If the connection with a video visit is poor, the visit may have to be switched to a telephone visit. With either a video or telephone visit, we are not always able to ensure that we have a secure connection.  By engaging in this virtual visit, you consent to the provision of healthcare and authorize for your insurance to be billed (if applicable) for the services provided during this visit. Depending on your insurance coverage, you may receive a charge related to this service.  I need to obtain your verbal consent now. Are you willing to proceed with your visit today? Yvonne Black has provided verbal consent on 11/13/2023 for a virtual visit (video or telephone). Hyla Maillard, [place]  Date: 11/13/2023 8:36 AM   Virtual Visit via Video Note   I, Hyla Maillard, connected with  Yvonne Black  ([id number], 1986/06/08) on 11/13/23 at  8:15 AM EDT by a video-enabled telemedicine application and verified that I am speaking with the correct person using two identifiers.  Location: Patient: Virtual Visit Location Patient:  Home Provider: Virtual Visit Location Provider: Home Office   I discussed the limitations of evaluation and management by telemedicine and the availability of in person appointments. The patient expressed understanding and agreed to proceed.    History of Present Illness: Yvonne Black is a 38 y.o. who identifies as a female who was assigned female at birth, and is being seen today for pain at site of extracted tooth. Notes she had been out of [place] and had broken a tooth, requiring a more emergent visit with a dental provider there for an extraction. Notes successful procedure. Now several days out is starting to note some increased pain in the area with mild swelling. Denies fever, chills. Denies drainage from the area. Is back in [place] but does not have a dental provider here.   HPI: HPI  Problems:  Patient Active Problem List   Diagnosis Date Noted   Snoring 02/02/2022   Encounter to establish care 12/09/2020   Chronic midline thoracic back pain 12/09/2020   IUD (intrauterine device) in place 11/30/2019   History of cesarean section 10/09/2019   Urge incontinence 10/09/2019   History of anemia 10/09/2019   BMI 31.0-31.9,adult 10/09/2019   Hot flashes 10/09/2019   Weight gain 10/09/2019   Premature ovarian failure 08/29/2017    Allergies:  Allergies  Allergen Reactions   Iodine Anaphylaxis   Latex Hives   Penicillins    Shellfish Allergy    Medications:  Current Outpatient Medications:    clindamycin (CLEOCIN) 300 MG capsule, Take  1 capsule (300 mg total) by mouth 3 (three) times daily for 7 days., Disp: 21 capsule, Rfl: 0   naproxen  (NAPROSYN ) 500 MG tablet, Take 1 tablet (500 mg total) by mouth 2 (two) times daily with a meal., Disp: 20 tablet, Rfl: 0   fluticasone  (FLONASE ) 50 MCG/ACT nasal spray, Place 2 sprays into both nostrils daily., Disp: 16 g, Rfl: 1   fluticasone  (FLONASE ) 50 MCG/ACT nasal spray, Place 2 sprays into both nostrils daily., Disp: 16 g, Rfl: 0    hydrocortisone  ointment 0.5 %, Apply 1 Application topically 2 (two) times daily., Disp: 30 g, Rfl: 0   phentermine  37.5 MG capsule, Take 1 capsule (37.5 mg total) by mouth every morning., Disp: 30 capsule, Rfl: 2   Semaglutide -Weight Management (WEGOVY ) 0.25 MG/0.5ML SOAJ, Inject 0.25 mg into the skin once a week., Disp: 2 mL, Rfl: 1   triamcinolone  cream (KENALOG ) 0.1 %, Apply 1 Application topically 2 (two) times daily., Disp: 15 g, Rfl: 0   triamcinolone  ointment (KENALOG ) 0.5 %, Apply 1 Application topically 2 (two) times daily., Disp: 30 g, Rfl: 1   Vitamin D , Ergocalciferol , (DRISDOL ) 1.25 MG (50000 UNIT) CAPS capsule, Take 1 capsule (50,000 Units total) by mouth every 7 (seven) days., Disp: 5 capsule, Rfl: 3   Vitamin D , Ergocalciferol , (DRISDOL ) 1.25 MG (50000 UNIT) CAPS capsule, Take 1 capsule (50,000 Units total) by mouth every 7 (seven) days., Disp: 5 capsule, Rfl: 0  Observations/Objective: Patient is well-developed, well-nourished in no acute distress.  Resting comfortably at home.  Head is normocephalic, atraumatic.  No labored breathing. Speech is clear and coherent with logical content.  Patient is alert and oriented at baseline.   Assessment and Plan: 1. Dental infection (Primary) - clindamycin (CLEOCIN) 300 MG capsule; Take 1 capsule (300 mg total) by mouth 3 (three) times daily for 7 days.  Dispense: 21 capsule; Refill: 0 - naproxen  (NAPROSYN ) 500 MG tablet; Take 1 tablet (500 mg total) by mouth 2 (two) times daily with a meal.  Dispense: 20 tablet; Refill: 0  Post-extraction. Start Clindamycin and Naprosyn . Discussed use of clove paste as well. Needs in person follow-up. She is trying to get in with a dental provider here in the area. Discussed if unable, want her to have follow-up at least with PCP or at local urgent care.   Follow Up Instructions: I discussed the assessment and treatment plan with the patient. The patient was provided an opportunity to ask questions and  all were answered. The patient agreed with the plan and demonstrated an understanding of the instructions.  A copy of instructions were sent to the patient via MyChart unless otherwise noted below.   The patient was advised to call back or seek an in-person evaluation if the symptoms worsen or if the condition fails to improve as anticipated.    Hyla Maillard, PA-C

## 2024-01-15 ENCOUNTER — Telehealth: Admitting: Physician Assistant

## 2024-01-15 DIAGNOSIS — R198 Other specified symptoms and signs involving the digestive system and abdomen: Secondary | ICD-10-CM

## 2024-01-15 DIAGNOSIS — N941 Unspecified dyspareunia: Secondary | ICD-10-CM

## 2024-01-15 DIAGNOSIS — N898 Other specified noninflammatory disorders of vagina: Secondary | ICD-10-CM

## 2024-01-15 DIAGNOSIS — R300 Dysuria: Secondary | ICD-10-CM

## 2024-01-15 NOTE — Progress Notes (Signed)
  Because of painful bowel movement, painful intercourse, urinary symptoms and vaginal symptoms and need for exam and further workup, I feel your condition warrants further evaluation and I recommend that you be seen in a face-to-face visit.   NOTE: There will be NO CHARGE for this E-Visit   If you are having a true medical emergency, please call 911.     For an urgent face to face visit, [facility] has multiple urgent care centers for your convenience.  Click the link below for the full list of locations and hours, walk-in wait times, appointment scheduling options and driving directions:  Urgent Care - [place], [place], [place], [place], [place], [place]  [place]     Your MyChart E-visit questionnaire answers were reviewed by a board certified advanced clinical practitioner to complete your personal care plan based on your specific symptoms.    Thank you for using e-Visits.

## 2024-06-25 ENCOUNTER — Telehealth: Admitting: Physician Assistant

## 2024-06-25 ENCOUNTER — Other Ambulatory Visit: Payer: Self-pay | Admitting: Family

## 2024-06-25 DIAGNOSIS — J069 Acute upper respiratory infection, unspecified: Secondary | ICD-10-CM | POA: Diagnosis not present

## 2024-06-25 MED ORDER — LIDOCAINE VISCOUS HCL 2 % MT SOLN: 15.0000 mL | OROMUCOSAL | 0 refills | Status: DC | PRN

## 2024-06-25 MED ORDER — AZITHROMYCIN 250 MG PO TABS: ORAL_TABLET | ORAL | 0 refills | Status: AC

## 2024-06-25 NOTE — Progress Notes (Signed)
 Virtual Visit Consent   Yvonne Black, you are scheduled for a virtual visit with a [facility] provider today. Just as with appointments in the office, your consent must be obtained to participate. Your consent will be active for this visit and any virtual visit you may have with one of our providers in the next 365 days. If you have a MyChart account, a copy of this consent can be sent to you electronically.  As this is a virtual visit, video technology does not allow for your provider to perform a traditional examination. This may limit your provider's ability to fully assess your condition. If your provider identifies any concerns that need to be evaluated in person or the need to arrange testing (such as labs, EKG, etc.), we will make arrangements to do so. Although advances in technology are sophisticated, we cannot ensure that it will always work on either your end or our end. If the connection with a video visit is poor, the visit may have to be switched to a telephone visit. With either a video or telephone visit, we are not always able to ensure that we have a secure connection.  By engaging in this virtual visit, you consent to the provision of healthcare and authorize for your insurance to be billed (if applicable) for the services provided during this visit. Depending on your insurance coverage, you may receive a charge related to this service.  I need to obtain your verbal consent now. Are you willing to proceed with your visit today? Yvonne Black has provided verbal consent on 06/25/2024 for a virtual visit (video or telephone). Yvonne Black, [place]  Date: 06/25/2024 9:54 AM   Virtual Visit via Video Note   I, Yvonne Black, connected with  Yvonne Black  ([id number], [birth year]) on 06/25/2024 at  9:45 AM EST by a video-enabled telemedicine application and verified that I am speaking with the correct person using two identifiers.  Location: Patient: Virtual Visit Location  Patient: Home Provider: Virtual Visit Location Provider: Home Office   I discussed the limitations of evaluation and management by telemedicine and the availability of in person appointments. The patient expressed understanding and agreed to proceed.    History of Present Illness: Yvonne Black is a 38 y.o. who identifies as a female who was assigned female at birth, and is being seen today for URI symptoms over the past several days, starting with nasal congestion, sore throat, sinus congestion, moving into more chest congestion and productive cough with change in sputum production. New onset of fever in the past few days. Denies recent travel. Took home COVID test that was negative.   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  Resting comfortably  at home.  Head is normocephalic, atraumatic.  No labored breathing.  Speech is clear and coherent with logical content.  Patient is alert and oriented at baseline.   Assessment and Plan: 1. Upper respiratory tract infection, unspecified type (Primary) - azithromycin  (ZITHROMAX )  250 MG tablet; Take 2 tablets on day 1, then 1 tablet daily on days 2 through 5  Dispense: 6 tablet; Refill: 0 - promethazine -dextromethorphan (PROMETHAZINE -DM) 6.25-15 MG/5ML syrup; Take 5 mLs by mouth 4 (four) times daily as needed for cough.  Dispense: 118 mL; Refill: 0 - lidocaine  (XYLOCAINE ) 2 %  solution; Use as directed 15 mLs in the mouth or throat as needed.  Dispense: 200 mL; Refill: 0  Discussed viral versus bacterial etiology. Discussed most likely viral but giving new onset of fever, change in sputum, there is concern for bacterial cause. Supportive measures and OTC medications reviewed. Azithromycin  and Promethazine -DM per orders. Viscous lidocaine  as needed for sore throat if not managed by OTC pain relievers.  Follow Up Instructions: I discussed the assessment and treatment plan with the patient. The patient was provided an opportunity to ask questions and all were answered. The patient agreed with the plan and demonstrated an understanding of the instructions.  A copy of instructions were sent to the patient via MyChart unless otherwise noted below.  The patient was advised to call back or seek an in-person evaluation if the symptoms worsen or if the condition fails to improve as anticipated.    Yvonne Velma Lunger, PA-C    [1]  Allergies Allergen Reactions   Iodine Anaphylaxis   Latex Hives   Penicillins    Shellfish Allergy   [2]  Current Outpatient Medications:    azithromycin  (ZITHROMAX ) 250 MG tablet, Take 2 tablets on day 1, then 1 tablet daily on days 2 through 5, Disp: 6 tablet, Rfl: 0   lidocaine  (XYLOCAINE ) 2 % solution, Use as directed 15 mLs in the mouth or throat as needed., Disp: 200 mL, Rfl: 0   promethazine -dextromethorphan (PROMETHAZINE -DM) 6.25-15 MG/5ML syrup, Take 5 mLs by mouth 4 (four) times daily as needed for cough., Disp: 118 mL, Rfl: 0   fluticasone  (FLONASE ) 50 MCG/ACT nasal spray, Place 2 sprays into both nostrils daily., Disp: 16 g, Rfl: 1   fluticasone  (FLONASE ) 50 MCG/ACT nasal spray, Place 2 sprays into both nostrils daily., Disp: 16 g, Rfl: 0   hydrocortisone  ointment 0.5 %, Apply 1 Application topically 2 (two) times daily., Disp: 30 g, Rfl: 0   phentermine  37.5 MG capsule, Take 1 capsule (37.5 mg total) by mouth every morning., Disp:  30 capsule, Rfl: 2   Semaglutide -Weight Management (WEGOVY ) 0.25 MG/0.5ML SOAJ, Inject 0.25 mg into the skin once a week., Disp: 2 mL, Rfl: 1   triamcinolone  ointment (KENALOG ) 0.5 %, Apply 1 Application topically 2 (two) times daily., Disp: 30 g, Rfl: 1   Vitamin D , Ergocalciferol , (DRISDOL ) 1.25 MG (50000 UNIT) CAPS capsule, Take 1 capsule (50,000 Units total) by mouth every 7 (seven) days., Disp: 5 capsule, Rfl: 3   Vitamin D , Ergocalciferol , (DRISDOL ) 1.25 MG (50000 UNIT) CAPS capsule, Take 1 capsule (50,000 Units total) by mouth every 7 (seven) days., Disp: 5 capsule, Rfl: 0

## 2024-06-25 NOTE — Patient Instructions (Signed)
°  Yvonne Black, thank you for joining [name], PA-C for today's virtual visit.  While this provider is not your primary care provider (PCP), if your PCP is located in our provider database this encounter information will be shared with them immediately following your visit.   A [facility] MyChart account gives you access to today's visit and all your visits, tests, and labs performed at [facility]  click here if you don't have a [facility] MyChart account or go to [email or web address]  Consent: (Patient) Yvonne Black provided verbal consent for this virtual visit at the beginning of the encounter.  Current Medications:  Current Outpatient Medications:    fluticasone  (FLONASE ) 50 MCG/ACT nasal spray, Place 2 sprays into both nostrils daily., Disp: 16 g, Rfl: 1   fluticasone  (FLONASE ) 50 MCG/ACT nasal spray, Place 2 sprays into both nostrils daily., Disp: 16 g, Rfl: 0   hydrocortisone  ointment 0.5 %, Apply 1 Application topically 2 (two) times daily., Disp: 30 g, Rfl: 0   naproxen  (NAPROSYN ) 500 MG tablet, Take 1 tablet (500 mg total) by mouth 2 (two) times daily with a meal., Disp: 20 tablet, Rfl: 0   phentermine  37.5 MG capsule, Take 1 capsule (37.5 mg total) by mouth every morning., Disp: 30 capsule, Rfl: 2   Semaglutide -Weight Management (WEGOVY ) 0.25 MG/0.5ML SOAJ, Inject 0.25 mg into the skin once a week., Disp: 2 mL, Rfl: 1   triamcinolone  cream (KENALOG ) 0.1 %, Apply 1 Application topically 2 (two) times daily., Disp: 15 g, Rfl: 0   triamcinolone  ointment (KENALOG ) 0.5 %, Apply 1 Application topically 2 (two) times daily., Disp: 30 g, Rfl: 1   Vitamin D , Ergocalciferol , (DRISDOL ) 1.25 MG (50000 UNIT) CAPS capsule, Take 1 capsule (50,000 Units total) by mouth every 7 (seven) days., Disp: 5 capsule, Rfl: 3   Vitamin D , Ergocalciferol , (DRISDOL ) 1.25 MG (50000 UNIT) CAPS capsule, Take 1 capsule (50,000 Units total) by mouth every 7 (seven) days., Disp: 5 capsule,  Rfl: 0   Medications ordered in this encounter:  No orders of the defined types were placed in this encounter.    *If you need refills on other medications prior to your next appointment, please contact your pharmacy*  Follow-Up: Call back or seek an in-person evaluation if the symptoms worsen or if the condition fails to improve as anticipated.  [facility] [phone]  Other Instructions Hydrate and rest. Ok to use plain Mucinex OTC. Alternate Tylenol and Ibuprofen as needed. Take all prescribed medications as directed. If you note any non-resolving, new, or worsening symptoms despite treatment, please seek an in-person evaluation ASAP.    If you have been instructed to have an in-person evaluation today at a local Urgent Care facility, please use the link below. It will take you to a list of all of our available [facility], including address, phone number and hours of operation. Please do not delay care.  [facility]  If you or a family member do not have a primary care provider, use the link below to schedule a visit and establish care. When you choose a [facility] primary care physician or advanced practice provider, you gain a long-term partner in health. Find a Primary Care Provider  Learn more about [facility]'s in-office and virtual care options: [facility] - Get Care Now

## 2024-06-26 NOTE — Telephone Encounter (Signed)
 Please schedule follow up appointment for routine visit last seen in 2024.

## 2024-06-26 NOTE — Telephone Encounter (Signed)
 High risk warning populated when attempting to refill medication  Please review and approval if appropriate   Thanks,  [facility] W/CMA

## 2024-06-29 MED ORDER — LIDOCAINE VISCOUS HCL 2 % MT SOLN: 15.0000 mL | Freq: Four times a day (QID) | OROMUCOSAL | 0 refills | Status: DC | PRN

## 2024-06-29 NOTE — Addendum Note (Signed)
 Addended by: VIVIENNE DELON HERO on: 06/29/2024 11:59 AM   Modules accepted: Orders

## 2024-08-14 ENCOUNTER — Telehealth: Admitting: Physician Assistant

## 2024-08-14 ENCOUNTER — Telehealth

## 2024-08-14 DIAGNOSIS — J029 Acute pharyngitis, unspecified: Secondary | ICD-10-CM

## 2024-08-14 MED ORDER — PREDNISONE 10 MG (21) PO TBPK: ORAL_TABLET | ORAL | 0 refills | Status: AC

## 2024-08-14 MED ORDER — LIDOCAINE VISCOUS HCL 2 % MT SOLN: 15.0000 mL | OROMUCOSAL | 0 refills | Status: AC | PRN

## 2024-08-14 NOTE — Progress Notes (Signed)
 Message sent to patient requesting further input regarding current symptoms. Awaiting patient response.
